# Patient Record
Sex: Female | Born: 1955
Health system: Southern US, Community
[De-identification: ages and names within clinical notes are randomized; demographics above are authoritative.]

## PROBLEM LIST (undated history)

## (undated) DIAGNOSIS — J302 Other seasonal allergic rhinitis: Secondary | ICD-10-CM

## (undated) DIAGNOSIS — R928 Other abnormal and inconclusive findings on diagnostic imaging of breast: Secondary | ICD-10-CM

## (undated) DIAGNOSIS — Z789 Other specified health status: Secondary | ICD-10-CM

## (undated) HISTORY — DX: Other abnormal and inconclusive findings on diagnostic imaging of breast: R92.8

## (undated) HISTORY — PX: TONSILLECTOMY: SUR1361

## (undated) HISTORY — PX: BREAST LUMPECTOMY: SHX2

---

## 1989-06-24 HISTORY — PX: CHOLECYSTECTOMY: SHX55

## 1989-06-24 HISTORY — PX: HEMANGIOMA EXCISION: SHX1734

## 1999-11-02 ENCOUNTER — Encounter: Payer: Self-pay | Admitting: Gynecology

## 1999-11-02 ENCOUNTER — Encounter: Admission: RE | Admit: 1999-11-02 | Discharge: 1999-11-02 | Payer: Self-pay | Admitting: Gynecology

## 2001-07-08 ENCOUNTER — Encounter: Admission: RE | Admit: 2001-07-08 | Discharge: 2001-07-08 | Payer: Self-pay | Admitting: Gynecology

## 2001-07-08 ENCOUNTER — Encounter: Payer: Self-pay | Admitting: Gynecology

## 2001-07-22 ENCOUNTER — Other Ambulatory Visit: Admission: RE | Admit: 2001-07-22 | Discharge: 2001-07-22 | Payer: Self-pay | Admitting: Gynecology

## 2003-12-06 ENCOUNTER — Other Ambulatory Visit: Admission: RE | Admit: 2003-12-06 | Discharge: 2003-12-06 | Payer: Self-pay | Admitting: Gynecology

## 2003-12-08 ENCOUNTER — Encounter: Admission: RE | Admit: 2003-12-08 | Discharge: 2003-12-08 | Payer: Self-pay | Admitting: Gynecology

## 2005-01-10 ENCOUNTER — Other Ambulatory Visit: Admission: RE | Admit: 2005-01-10 | Discharge: 2005-01-10 | Payer: Self-pay | Admitting: Gynecology

## 2005-01-18 ENCOUNTER — Encounter: Admission: RE | Admit: 2005-01-18 | Discharge: 2005-01-18 | Payer: Self-pay | Admitting: Gynecology

## 2006-06-24 HISTORY — PX: DILATION AND CURETTAGE OF UTERUS: SHX78

## 2006-07-17 ENCOUNTER — Other Ambulatory Visit: Admission: RE | Admit: 2006-07-17 | Discharge: 2006-07-17 | Payer: Self-pay | Admitting: Gynecology

## 2006-07-24 ENCOUNTER — Encounter: Admission: RE | Admit: 2006-07-24 | Discharge: 2006-07-24 | Payer: Self-pay | Admitting: Gynecology

## 2006-08-07 ENCOUNTER — Encounter: Admission: RE | Admit: 2006-08-07 | Discharge: 2006-08-07 | Payer: Self-pay | Admitting: Gynecology

## 2006-08-19 ENCOUNTER — Ambulatory Visit (HOSPITAL_BASED_OUTPATIENT_CLINIC_OR_DEPARTMENT_OTHER): Admission: RE | Admit: 2006-08-19 | Discharge: 2006-08-19 | Payer: Self-pay | Admitting: Gynecology

## 2006-08-19 ENCOUNTER — Encounter (INDEPENDENT_AMBULATORY_CARE_PROVIDER_SITE_OTHER): Payer: Self-pay | Admitting: *Deleted

## 2007-11-09 ENCOUNTER — Encounter: Admission: RE | Admit: 2007-11-09 | Discharge: 2007-11-09 | Payer: Self-pay | Admitting: Gynecology

## 2007-11-19 ENCOUNTER — Encounter: Admission: RE | Admit: 2007-11-19 | Discharge: 2007-11-19 | Payer: Self-pay | Admitting: Gynecology

## 2008-03-25 ENCOUNTER — Encounter: Admission: RE | Admit: 2008-03-25 | Discharge: 2008-03-25 | Payer: Self-pay | Admitting: Gynecology

## 2008-10-04 ENCOUNTER — Encounter: Admission: RE | Admit: 2008-10-04 | Discharge: 2008-10-04 | Payer: Self-pay | Admitting: Gynecology

## 2010-01-09 ENCOUNTER — Encounter: Admission: RE | Admit: 2010-01-09 | Discharge: 2010-01-09 | Payer: Self-pay | Admitting: Gynecology

## 2010-07-15 ENCOUNTER — Encounter: Payer: Self-pay | Admitting: Gynecology

## 2010-11-09 NOTE — Op Note (Signed)
[name], Kerry Monroe               ACCOUNT NO.:  [id number]   MEDICAL RECORD NO.:  [id number]          PATIENT TYPE:  AMB   LOCATION:  [facility]                         FACILITY:  [facility] ([place])   PHYSICIAN:  Gretta Cool, M.D. DATE OF BIRTH:  Sep 25, 1955   DATE OF PROCEDURE:  08/19/2006  DATE OF DISCHARGE:                               OPERATIVE REPORT   PREOPERATIVE DIAGNOSIS:  Endometrial polyps, multiple, with abnormal  uterine bleeding.   POSTOPERATIVE DIAGNOSIS:  Endometrial polyps, multiple, with abnormal  uterine bleeding.   PROCEDURE:  Hysteroscopy, resection of multiple endometrial polyps, and  total endometrial resection for ablation plus VaporTrode.   SURGEON:  Gretta Cool, M.D.   ANESTHESIA:  Paracervical block and IV sedation.   DESCRIPTION OF PROCEDURE:  Under excellent general anesthesia, as above.  Her cervix was grasped with a single-tooth tenaculum and then  progressively dilated with a series of Pratt dilators to accommodate the  7 mm resectoscope.  At this point, the resectoscope was introduced, and  the uterine cavity photographed.  There were two large polyps protruding  from the fundal uterine fundus, one from the left cornual area and one  from the right.  There was also a prolapsed fibroid hanging through the  cervix to the outside.  These polyps were progressively resected so as  to remove the entire polyps, and then the endometrial cavity was totally  resected, extending 360 degrees around the cavity.  Once all of the  evidence of gland openings was resected, the endometrial cavity was  treated by VaporTrode at 200 watts per cut.  The entire endometrial  cavity was then treated so as to eradicate any superficial myometrium  and help prevent recurrence of bleeding or polyp formation.  At this  point, once the entire cavity was treated, the pressure was reduced, and  the bleeding points coagulated.  At this point, patient returned to the  recovery room in  excellent condition without complication.  Estimated  fluid deficit 130 cc.   COMPLICATIONS:  None.           ______________________________  Gretta Cool, M.D.     CWL/MEDQ  D:  08/19/2006  T:  08/19/2006  Job:  [id number]

## 2011-01-28 ENCOUNTER — Other Ambulatory Visit: Payer: Self-pay | Admitting: Gynecology

## 2011-01-28 DIAGNOSIS — Z1231 Encounter for screening mammogram for malignant neoplasm of breast: Secondary | ICD-10-CM

## 2011-02-05 ENCOUNTER — Ambulatory Visit
Admission: RE | Admit: 2011-02-05 | Discharge: 2011-02-05 | Disposition: A | Discharge status: Home / Self Care | Payer: BC Managed Care – PPO | Source: Ambulatory Visit | Attending: Gynecology | Admitting: Gynecology

## 2011-02-05 DIAGNOSIS — Z1231 Encounter for screening mammogram for malignant neoplasm of breast: Secondary | ICD-10-CM

## 2011-02-08 ENCOUNTER — Other Ambulatory Visit: Payer: Self-pay | Admitting: Gynecology

## 2011-06-17 ENCOUNTER — Ambulatory Visit (INDEPENDENT_AMBULATORY_CARE_PROVIDER_SITE_OTHER): Payer: BC Managed Care – PPO

## 2011-06-17 DIAGNOSIS — R0902 Hypoxemia: Secondary | ICD-10-CM

## 2011-06-17 DIAGNOSIS — H66009 Acute suppurative otitis media without spontaneous rupture of ear drum, unspecified ear: Secondary | ICD-10-CM

## 2011-06-17 DIAGNOSIS — R0602 Shortness of breath: Secondary | ICD-10-CM

## 2012-07-09 ENCOUNTER — Other Ambulatory Visit: Payer: Self-pay | Admitting: Gynecology

## 2012-07-09 DIAGNOSIS — Z1231 Encounter for screening mammogram for malignant neoplasm of breast: Secondary | ICD-10-CM

## 2012-07-17 ENCOUNTER — Other Ambulatory Visit: Payer: Self-pay | Admitting: Gynecology

## 2012-07-17 DIAGNOSIS — D1800 Hemangioma unspecified site: Secondary | ICD-10-CM

## 2012-07-29 ENCOUNTER — Other Ambulatory Visit: Payer: BC Managed Care – PPO

## 2012-07-29 ENCOUNTER — Ambulatory Visit
Admission: RE | Admit: 2012-07-29 | Discharge: 2012-07-29 | Disposition: A | Discharge status: Home / Self Care | Payer: BC Managed Care – PPO | Source: Ambulatory Visit | Attending: Gynecology | Admitting: Gynecology

## 2012-07-29 DIAGNOSIS — Z1231 Encounter for screening mammogram for malignant neoplasm of breast: Secondary | ICD-10-CM

## 2012-07-30 ENCOUNTER — Other Ambulatory Visit: Payer: Self-pay | Admitting: Gynecology

## 2012-07-30 DIAGNOSIS — R928 Other abnormal and inconclusive findings on diagnostic imaging of breast: Secondary | ICD-10-CM

## 2012-08-05 ENCOUNTER — Ambulatory Visit: Payer: BC Managed Care – PPO

## 2012-08-05 ENCOUNTER — Ambulatory Visit
Admission: RE | Admit: 2012-08-05 | Discharge: 2012-08-05 | Disposition: A | Discharge status: Home / Self Care | Payer: BC Managed Care – PPO | Source: Ambulatory Visit | Attending: Gynecology | Admitting: Gynecology

## 2012-08-05 DIAGNOSIS — D1800 Hemangioma unspecified site: Secondary | ICD-10-CM

## 2012-08-12 ENCOUNTER — Ambulatory Visit: Payer: BC Managed Care – PPO

## 2012-08-19 ENCOUNTER — Ambulatory Visit
Admission: RE | Admit: 2012-08-19 | Discharge: 2012-08-19 | Disposition: A | Discharge status: Home / Self Care | Payer: BC Managed Care – PPO | Source: Ambulatory Visit | Attending: Gynecology | Admitting: Gynecology

## 2012-08-19 DIAGNOSIS — R928 Other abnormal and inconclusive findings on diagnostic imaging of breast: Secondary | ICD-10-CM

## 2012-09-01 ENCOUNTER — Ambulatory Visit (INDEPENDENT_AMBULATORY_CARE_PROVIDER_SITE_OTHER): Payer: BC Managed Care – PPO | Admitting: Surgery

## 2012-09-01 ENCOUNTER — Encounter (INDEPENDENT_AMBULATORY_CARE_PROVIDER_SITE_OTHER): Payer: Self-pay | Admitting: Surgery

## 2012-09-01 VITALS — BP 116/72 | HR 84 | Temp 97.6°F | Resp 12 | Ht 66.5 in | Wt 175.0 lb

## 2012-09-01 DIAGNOSIS — R928 Other abnormal and inconclusive findings on diagnostic imaging of breast: Secondary | ICD-10-CM

## 2012-09-01 HISTORY — DX: Other abnormal and inconclusive findings on diagnostic imaging of breast: R92.8

## 2012-09-01 NOTE — Progress Notes (Signed)
Kerry Monroe       DOB: [birth year]           DATE: 09/01/2012       ZOX:[id number]  CC:  Chief Complaint  Patient presents with  . Breast Problem    eval lt bt distortion    HPI: this patient recently had a 3-D mammogram an area of architectural distortion was reported to be in the left breast medially. A followup mammogram and ultrasound were unremarkable and the abnormality was not seen on the mammogram. Because of the findings on the 3-D mammogram surgical consultation for excisional biopsy was recommended. The patient is having no breast symptoms. She does in the past she has had areas of abnormality noted and followed up but no biopsy was needed. She does have a family history of postmenopausal breast cancer in her mother.  EXAM: Vital signs: BP 116/72  Pulse 84  Temp(Src) 97.6 F (36.4 C) (Temporal)  Resp 12  Ht 5' 6.5" (1.689 m)  Wt 175 lb (79.379 kg)  BMI 27.83 kg/m2  General: Patient alert, oriented, NAD  Breasts: The breasts are symmetric. There are no masses on either side. They're not tender. They're slightly irregular consistent with fibrocystic changes but this is minimal. The skin and nipple and areolar areas are normal. Lymphatics: There is no axillary or supraclavicular adenopathy noted.  Data reviewed: I have reviewed the current films and reports. I would the breast Center and reviewed films dating back to 2009. There is a similar noted the area in 2009 but not subsequently seen. It is in the same area as the current abnormality. The full 3-D mammogram is not available, only a few sample images. I discussed this at length with the radiologist and he is going to review all of the 3-D mammogram films before making position. It is possible to do a 3 localized excisional breast biopsy is needed. IMP: area of architectural distortion left breast medial aspect uncertain etiology  PLAN: I await for the radiologist to further review 3 mammogram before making a  decision. One option here might be to do an MRI and perhaps an MRI guided needle biopsy if a needle core biopsy is not feasible with mammographic imaging.  Jermiya Reichl J 09/01/2012

## 2012-09-01 NOTE — Patient Instructions (Signed)
I will be reviewing your films with the radiologist and call you after that

## 2012-09-02 ENCOUNTER — Telehealth (INDEPENDENT_AMBULATORY_CARE_PROVIDER_SITE_OTHER): Payer: Self-pay | Admitting: Surgery

## 2012-09-02 ENCOUNTER — Other Ambulatory Visit (INDEPENDENT_AMBULATORY_CARE_PROVIDER_SITE_OTHER): Payer: Self-pay | Admitting: Surgery

## 2012-09-02 DIAGNOSIS — R928 Other abnormal and inconclusive findings on diagnostic imaging of breast: Secondary | ICD-10-CM

## 2012-09-02 NOTE — Telephone Encounter (Signed)
I discussed with radiologist further. This is a spiculated mass effect on the 3D mammogram and, given that it is not on sono or regular mammogram it is more likely a radial scar than cancer. It is not really amenable to a stereo biopsy. ON MRI could be done then a biopsy, or, since is is likely radial scar and would be likely recommended for excision, we could do a NL excision. I have reviewd this with the patient, discussed alternatives etc. She would like to schedule an excision

## 2012-09-03 ENCOUNTER — Encounter (HOSPITAL_COMMUNITY): Payer: Self-pay | Admitting: Pharmacy Technician

## 2012-09-17 ENCOUNTER — Ambulatory Visit: Admit: 2012-09-17 | Payer: Self-pay | Admitting: Surgery

## 2012-09-17 ENCOUNTER — Inpatient Hospital Stay: Admission: RE | Admit: 2012-09-17 | Payer: BC Managed Care – PPO | Source: Ambulatory Visit

## 2012-09-17 SURGERY — BREAST BIOPSY WITH NEEDLE LOCALIZATION
Anesthesia: General | Site: Breast | Laterality: Left

## 2012-10-26 ENCOUNTER — Encounter (HOSPITAL_BASED_OUTPATIENT_CLINIC_OR_DEPARTMENT_OTHER): Payer: Self-pay | Admitting: *Deleted

## 2012-10-26 NOTE — Progress Notes (Signed)
No labs needed

## 2012-10-29 ENCOUNTER — Ambulatory Visit (HOSPITAL_BASED_OUTPATIENT_CLINIC_OR_DEPARTMENT_OTHER): Admission: RE | Admit: 2012-10-29 | Payer: BC Managed Care – PPO | Source: Ambulatory Visit | Admitting: Surgery

## 2012-10-29 ENCOUNTER — Telehealth (INDEPENDENT_AMBULATORY_CARE_PROVIDER_SITE_OTHER): Payer: Self-pay | Admitting: General Surgery

## 2012-10-29 ENCOUNTER — Inpatient Hospital Stay: Admission: RE | Admit: 2012-10-29 | Payer: BC Managed Care – PPO | Source: Ambulatory Visit

## 2012-10-29 HISTORY — DX: Other specified health status: Z78.9

## 2012-10-29 HISTORY — DX: Other seasonal allergic rhinitis: J30.2

## 2012-10-29 SURGERY — BREAST BIOPSY WITH NEEDLE LOCALIZATION
Anesthesia: General | Site: Breast | Laterality: Left

## 2012-10-29 NOTE — Telephone Encounter (Signed)
Spoke with her today and think she will be OK doing surgery on the 27th

## 2012-10-29 NOTE — Telephone Encounter (Signed)
I called Kerry Monroe and explained for her type of surgery she could feel okay in a couple days to a week unless she has any post op complications like an infection. She wanted to know how long it is okay to wait for her surgery since she wants a Friday and Dr Jamey Ripa is not available any Friday in June accept the 8th, which is her daughter's wedding. She did not want to talk with me about this and asked that I have Dr Jamey Ripa call her. It looks like Kerry Monroe cancelled her surgery on 09/17/2012, 09/24/2012 and it was recently cancelled for today 10/29/2012. Dr Jamey Ripa - Kerry Monroe requesting a call from you.

## 2012-10-29 NOTE — Telephone Encounter (Signed)
Message copied by Liliana Cline on Thu Oct 29, 2012  3:17 PM ------      Message from: Jari Sportsman      Created: Thu Oct 29, 2012  2:33 PM      Regarding: would like to speak with you       Contact: [phone]       Ms. Menard would like to speak to you about her procedure. She has daughter wedding June 8 wants to make sure will be ok       Thanks      Katie  ------

## 2012-11-05 MED ORDER — THROMBIN 20000 UNITS EX SOLR
CUTANEOUS | Status: AC
  Filled 2012-11-05: qty 20000

## 2012-11-10 ENCOUNTER — Encounter (HOSPITAL_BASED_OUTPATIENT_CLINIC_OR_DEPARTMENT_OTHER): Payer: Self-pay | Admitting: *Deleted

## 2012-11-10 NOTE — Progress Notes (Signed)
No labs needed

## 2012-11-17 ENCOUNTER — Encounter (HOSPITAL_BASED_OUTPATIENT_CLINIC_OR_DEPARTMENT_OTHER): Payer: Self-pay | Admitting: Anesthesiology

## 2012-11-17 ENCOUNTER — Encounter (INDEPENDENT_AMBULATORY_CARE_PROVIDER_SITE_OTHER): Payer: BC Managed Care – PPO | Admitting: Surgery

## 2012-11-17 ENCOUNTER — Ambulatory Visit (HOSPITAL_BASED_OUTPATIENT_CLINIC_OR_DEPARTMENT_OTHER)
Admission: RE | Admit: 2012-11-17 | Discharge: 2012-11-17 | Disposition: A | Discharge status: Home / Self Care | Payer: BC Managed Care – PPO | Source: Ambulatory Visit | Attending: Surgery | Admitting: Surgery

## 2012-11-17 ENCOUNTER — Ambulatory Visit
Admission: RE | Admit: 2012-11-17 | Discharge: 2012-11-17 | Disposition: A | Discharge status: Home / Self Care | Payer: BC Managed Care – PPO | Source: Ambulatory Visit | Attending: Surgery | Admitting: Surgery

## 2012-11-17 ENCOUNTER — Ambulatory Visit (HOSPITAL_BASED_OUTPATIENT_CLINIC_OR_DEPARTMENT_OTHER): Payer: BC Managed Care – PPO | Admitting: Anesthesiology

## 2012-11-17 ENCOUNTER — Encounter (HOSPITAL_BASED_OUTPATIENT_CLINIC_OR_DEPARTMENT_OTHER)
Admission: RE | Disposition: A | Discharge status: Home / Self Care | Payer: Self-pay | Source: Ambulatory Visit | Attending: Surgery

## 2012-11-17 DIAGNOSIS — R928 Other abnormal and inconclusive findings on diagnostic imaging of breast: Secondary | ICD-10-CM

## 2012-11-17 DIAGNOSIS — Z880 Allergy status to penicillin: Secondary | ICD-10-CM | POA: Insufficient documentation

## 2012-11-17 DIAGNOSIS — N6089 Other benign mammary dysplasias of unspecified breast: Secondary | ICD-10-CM | POA: Insufficient documentation

## 2012-11-17 HISTORY — PX: BREAST BIOPSY: SHX20

## 2012-11-17 SURGERY — BREAST BIOPSY WITH NEEDLE LOCALIZATION
Anesthesia: General | Site: Breast | Laterality: Left | Wound class: Clean

## 2012-11-17 MED ORDER — HYDROMORPHONE HCL PF 1 MG/ML IJ SOLN: 0.2500 mg | INTRAMUSCULAR | Status: DC | PRN

## 2012-11-17 MED ORDER — ONDANSETRON HCL 4 MG/2ML IJ SOLN: 4.0000 mg | Freq: Once | INTRAMUSCULAR | Status: DC | PRN

## 2012-11-17 MED ORDER — HYDROCODONE-ACETAMINOPHEN 5-325 MG PO TABS: 1.0000 | ORAL_TABLET | ORAL | Status: DC | PRN

## 2012-11-17 MED ORDER — LACTATED RINGERS IV SOLN
INTRAVENOUS | Status: DC
  Administered 2012-11-17: 20 mL/h via INTRAVENOUS
  Administered 2012-11-17: 11:00:00 via INTRAVENOUS

## 2012-11-17 MED ORDER — CHLORHEXIDINE GLUCONATE 4 % EX LIQD: 1.0000 "application " | Freq: Once | CUTANEOUS | Status: DC

## 2012-11-17 MED ORDER — FENTANYL CITRATE 0.05 MG/ML IJ SOLN
INTRAMUSCULAR | Status: DC | PRN
  Administered 2012-11-17: 100 ug via INTRAVENOUS
  Administered 2012-11-17 (×2): 25 ug via INTRAVENOUS

## 2012-11-17 MED ORDER — DEXAMETHASONE SODIUM PHOSPHATE 4 MG/ML IJ SOLN
INTRAMUSCULAR | Status: DC | PRN
  Administered 2012-11-17: 10 mg via INTRAVENOUS

## 2012-11-17 MED ORDER — OXYCODONE HCL 5 MG/5ML PO SOLN: 5.0000 mg | Freq: Once | ORAL | Status: DC | PRN

## 2012-11-17 MED ORDER — ONDANSETRON HCL 4 MG/2ML IJ SOLN
INTRAMUSCULAR | Status: DC | PRN
  Administered 2012-11-17: 4 mg via INTRAVENOUS

## 2012-11-17 MED ORDER — LIDOCAINE HCL (CARDIAC) 20 MG/ML IV SOLN
INTRAVENOUS | Status: DC | PRN
  Administered 2012-11-17: 80 mg via INTRAVENOUS

## 2012-11-17 MED ORDER — CIPROFLOXACIN IN D5W 400 MG/200ML IV SOLN
400.0000 mg | INTRAVENOUS | Status: AC
  Administered 2012-11-17: 400 mg via INTRAVENOUS

## 2012-11-17 MED ORDER — PROPOFOL 10 MG/ML IV BOLUS
INTRAVENOUS | Status: DC | PRN
  Administered 2012-11-17: 160 mg via INTRAVENOUS

## 2012-11-17 MED ORDER — OXYCODONE HCL 5 MG PO TABS: 5.0000 mg | ORAL_TABLET | Freq: Once | ORAL | Status: DC | PRN

## 2012-11-17 MED ORDER — MIDAZOLAM HCL 5 MG/5ML IJ SOLN
INTRAMUSCULAR | Status: DC | PRN
  Administered 2012-11-17: 2 mg via INTRAVENOUS

## 2012-11-17 MED ORDER — BUPIVACAINE HCL (PF) 0.25 % IJ SOLN
INTRAMUSCULAR | Status: DC | PRN
  Administered 2012-11-17: 20 mL

## 2012-11-17 SURGICAL SUPPLY — 48 items
ADH SKN CLS APL DERMABOND .7 (GAUZE/BANDAGES/DRESSINGS) ×1
APPLICATOR COTTON TIP 6IN STRL (MISCELLANEOUS) IMPLANT
BINDER BREAST LRG (GAUZE/BANDAGES/DRESSINGS) IMPLANT
BINDER BREAST MEDIUM (GAUZE/BANDAGES/DRESSINGS) IMPLANT
BINDER BREAST XLRG (GAUZE/BANDAGES/DRESSINGS) ×2 IMPLANT
BINDER BREAST XXLRG (GAUZE/BANDAGES/DRESSINGS) IMPLANT
BLADE HEX COATED 2.75 (ELECTRODE) ×2 IMPLANT
BLADE SURG 15 STRL LF DISP TIS (BLADE) ×1 IMPLANT
BLADE SURG 15 STRL SS (BLADE) ×2
CANISTER SUCTION 1200CC (MISCELLANEOUS) ×2 IMPLANT
CHLORAPREP W/TINT 26ML (MISCELLANEOUS) ×2 IMPLANT
CLIP TI MEDIUM 6 (CLIP) IMPLANT
CLIP TI WIDE RED SMALL 6 (CLIP) IMPLANT
CLOTH BEACON ORANGE TIMEOUT ST (SAFETY) ×2 IMPLANT
COVER MAYO STAND STRL (DRAPES) ×2 IMPLANT
COVER TABLE BACK 60X90 (DRAPES) ×2 IMPLANT
DECANTER SPIKE VIAL GLASS SM (MISCELLANEOUS) IMPLANT
DERMABOND ADVANCED (GAUZE/BANDAGES/DRESSINGS) ×1
DERMABOND ADVANCED .7 DNX12 (GAUZE/BANDAGES/DRESSINGS) ×1 IMPLANT
DEVICE DUBIN W/COMP PLATE 8390 (MISCELLANEOUS) ×2 IMPLANT
DRAPE LAPAROTOMY TRNSV 102X78 (DRAPE) ×2 IMPLANT
DRAPE SURG 17X23 STRL (DRAPES) IMPLANT
DRAPE UTILITY XL STRL (DRAPES) ×2 IMPLANT
ELECT REM PT RETURN 9FT ADLT (ELECTROSURGICAL) ×2
ELECTRODE REM PT RTRN 9FT ADLT (ELECTROSURGICAL) ×1 IMPLANT
GLOVE BIO SURGEON STRL SZ7 (GLOVE) ×4 IMPLANT
GLOVE BIOGEL PI IND STRL 7.0 (GLOVE) ×2 IMPLANT
GLOVE BIOGEL PI INDICATOR 7.0 (GLOVE) ×2
GLOVE EUDERMIC 7 POWDERFREE (GLOVE) ×2 IMPLANT
GOWN PREVENTION PLUS XLARGE (GOWN DISPOSABLE) IMPLANT
KIT MARKER MARGIN INK (KITS) IMPLANT
NEEDLE HYPO 25X1 1.5 SAFETY (NEEDLE) ×2 IMPLANT
NS IRRIG 1000ML POUR BTL (IV SOLUTION) ×2 IMPLANT
PACK BASIN DAY SURGERY FS (CUSTOM PROCEDURE TRAY) ×2 IMPLANT
PENCIL BUTTON HOLSTER BLD 10FT (ELECTRODE) ×2 IMPLANT
SHEET MEDIUM DRAPE 40X70 STRL (DRAPES) IMPLANT
SLEEVE SCD COMPRESS KNEE MED (MISCELLANEOUS) ×2 IMPLANT
SPONGE GAUZE 4X4 12PLY (GAUZE/BANDAGES/DRESSINGS) IMPLANT
SPONGE INTESTINAL PEANUT (DISPOSABLE) IMPLANT
SPONGE LAP 4X18 X RAY DECT (DISPOSABLE) ×2 IMPLANT
STAPLER VISISTAT 35W (STAPLE) IMPLANT
SUT MNCRL AB 4-0 PS2 18 (SUTURE) ×2 IMPLANT
SUT SILK 0 TIES 10X30 (SUTURE) IMPLANT
SUT VICRYL 3-0 CR8 SH (SUTURE) ×2 IMPLANT
SYR CONTROL 10ML LL (SYRINGE) ×2 IMPLANT
TOWEL OR NON WOVEN STRL DISP B (DISPOSABLE) ×2 IMPLANT
TUBE CONNECTING 20X1/4 (TUBING) ×2 IMPLANT
YANKAUER SUCT BULB TIP NO VENT (SUCTIONS) ×2 IMPLANT

## 2012-11-17 NOTE — H&P (Signed)
  [name] DOB: 17-Jan-1956 MRN: [id number]                                                                                      DATE: 10/29/2012  PCP: No primary provider on file. Referring Provider: No ref. provider found  IMPRESSION:  Architectural distortion left breast  PLAN:   or localized excision. I have discussed the indications for the lumpectomy and described the procedure. She understand that the chance of removal of the abnormal area is very good, but that occasionally we are unable to locate it and may have to do a second procedure. We also discussed the possibility of a second procedure to get additional tissue. Risks of surgery such as bleeding and infection have also been explained, as well as the implications of not doing the surgery. She understands and wishes to proceed.                  CC: No chief complaint on file.   HPI:  Kerry Monroe Monroe is a 57 y.o.  female who presents for surgery for an area of architectural distortion in the left breast that is not amenable to a needle core biopsy.  PMH:  has a past medical history of Seasonal allergies and Medical history non-contributory.  PSH:   has past surgical history that includes Cesarean section; Tonsillectomy; Dilation and curettage of uterus (2008); Hemangioma excision (1991); and Cholecystectomy (1991).  ALLERGIES:   Allergies  Allergen Reactions  . Penicillins Anaphylaxis    MEDICATIONS: Current facility-administered medications:chlorhexidine (HIBICLENS) 4 % liquid 1 application, 1 application, Topical, Once, [name], MD;  [name] ON 11/18/2012] chlorhexidine (HIBICLENS) 4 % liquid 1 application, 1 application, Topical, Once, [name], MD;  ciprofloxacin (CIPRO) IVPB 400 mg, 400 mg, Intravenous, On Call to OR, [name], MD lactated ringers infusion, , Intravenous, Continuous, Hart Robinsons, MD, Last Rate: 20 mL/hr at 11/17/12 1111   EXAM:   Vital signs:BP  112/67  Pulse 69  Temp(Src) 97.6 F (36.4 Kerry Monroe) (Oral)  Resp 20  Ht 5\' 6"  (1.676 m)  Wt 168 lb (76.204 kg)  BMI 27.13 kg/m2  SpO2 97% General: The patient alert oriented healthy-appearing, NAD Lungs: Clear to auscultation Heart: Regular no murmurs rubs or gallops Breasts: Guidewire in situ left breast  DATA REVIEWED:  I reviewed the wire localizing films.    Kerry Monroe Monroe J 11/17/2012  CC: No ref. provider found, No primary provider on file.

## 2012-11-17 NOTE — Anesthesia Preprocedure Evaluation (Signed)

## 2012-11-17 NOTE — Anesthesia Postprocedure Evaluation (Signed)
  Anesthesia Post-op Note  Patient: Kerry Monroe  Procedure(s) Performed: Procedure(s): BREAST mass WITH NEEDLE LOCALIZATION (Left)  Patient Location: PACU  Anesthesia Type:General  Level of Consciousness: awake, alert  and oriented  Airway and Oxygen Therapy: Patient Spontanous Breathing  Post-op Pain: mild  Post-op Assessment: Post-op Vital signs reviewed  Post-op Vital Signs: Reviewed  Complications: No apparent anesthesia complications

## 2012-11-17 NOTE — Op Note (Signed)
Kerry Monroe [birth year] [id number] 10/29/2012  Preoperative diagnosis: Architectural abnormality, left breast, uncertain etiology  Postoperative diagnosis: same  Procedure: wire localized excisional biopsy, left breast  Surgeon: [name], MD, FACS  Anesthesia: General   Clinical History and Indications: this patient has an area of architectural abnormality in the left breast which is not amenable to core biopsy and excision was recommended    Description of Procedure: I saw the patient in the preoperative area and confirmed the plans. I marked the left breast as the operative site. I reviewed the wire  localizing films. The patient was taken to the operating room and after satisfactory general anesthesia was obtained the left breast was prepped and draped and a time out was done. The guidewire entered in the inferior portion of the breast to tract somewhat superior and deep. I made a curvilinear incision, divided about 1.5 cm of fatty and subcutaneous tissue and grasped the guidewire tract with 2 Allis clamps. I then took a cylinder of tissue around the 2 guidewire clamps and wife I was beyond the tip of the guidewire I started to try to divide the tissue but found that the guidewire went even further in. I then replaced the Allis clamps a little bit deeper and continued to excise tissue around the guidewire until I was beyond the tip. I did note a small cystic mass with some gelatinous material that was about two thirds of the way down the specimen. I didn't feel any tumor in the specimen nor any tumor residual in the breasts. I was down to the chest wall inferior and posterior.  RD the specimen mammogram with the radiologist. I then made sure everything was dry, infiltrated 20 cc of 0.25% plain Marcaine, and closed in layers with 3-0 Vicryl, 4-0 Monocryl subcuticular, and Dermabond. The procedure well, there were no complications, and counts were correct. Blood loss was  minimal. [name], MD, [facility] 11/17/2012 12:37 PM

## 2012-11-17 NOTE — Transfer of Care (Signed)
Immediate Anesthesia Transfer of Care Note  Patient: Kerry Monroe  Procedure(s) Performed: Procedure(s): BREAST mass WITH NEEDLE LOCALIZATION (Left)  Patient Location: PACU  Anesthesia Type:General  Level of Consciousness: sedated  Airway & Oxygen Therapy: Patient Spontanous Breathing and Patient connected to face mask oxygen  Post-op Assessment: Report given to PACU RN and Post -op Vital signs reviewed and stable  Post vital signs: Reviewed and stable  Complications: No apparent anesthesia complications

## 2012-11-17 NOTE — Anesthesia Procedure Notes (Signed)
Procedure Name: LMA Insertion Date/Time: 11/17/2012 11:52 AM Performed by: Burna Cash Pre-anesthesia Checklist: Patient identified, Emergency Drugs available, Suction available and Patient being monitored Patient Re-evaluated:Patient Re-evaluated prior to inductionOxygen Delivery Method: Circle System Utilized Preoxygenation: Pre-oxygenation with 100% oxygen Intubation Type: IV induction Ventilation: Mask ventilation without difficulty LMA: LMA inserted LMA Size: 4.0 Number of attempts: 1 Airway Equipment and Method: bite block Placement Confirmation: positive ETCO2 Tube secured with: Tape Dental Injury: Teeth and Oropharynx as per pre-operative assessment

## 2012-11-18 ENCOUNTER — Telehealth (INDEPENDENT_AMBULATORY_CARE_PROVIDER_SITE_OTHER): Payer: Self-pay | Admitting: General Surgery

## 2012-11-18 NOTE — Telephone Encounter (Signed)
Patient is  Aware of po f/u visit day and time

## 2012-11-19 ENCOUNTER — Telehealth (INDEPENDENT_AMBULATORY_CARE_PROVIDER_SITE_OTHER): Payer: Self-pay | Admitting: General Surgery

## 2012-11-19 NOTE — Telephone Encounter (Signed)
Message copied by Liliana Cline on Thu Nov 19, 2012 10:08 AM ------      Message from: [name]      Created: Thu Nov 19, 2012  7:20 AM       Tell her path is benign and as expected ------

## 2012-11-19 NOTE — Telephone Encounter (Signed)
Patient made aware of path results. Will follow up at appt and call with any questions prior.  

## 2012-12-04 ENCOUNTER — Ambulatory Visit (INDEPENDENT_AMBULATORY_CARE_PROVIDER_SITE_OTHER): Payer: BC Managed Care – PPO | Admitting: Surgery

## 2012-12-04 ENCOUNTER — Encounter (INDEPENDENT_AMBULATORY_CARE_PROVIDER_SITE_OTHER): Payer: Self-pay | Admitting: Surgery

## 2012-12-04 VITALS — BP 116/72 | HR 72 | Temp 99.0°F | Resp 16 | Ht 67.0 in | Wt 167.8 lb

## 2012-12-04 DIAGNOSIS — R928 Other abnormal and inconclusive findings on diagnostic imaging of breast: Secondary | ICD-10-CM

## 2012-12-04 DIAGNOSIS — Z09 Encounter for follow-up examination after completed treatment for conditions other than malignant neoplasm: Secondary | ICD-10-CM

## 2012-12-04 NOTE — Patient Instructions (Signed)
We will see you again on an as needed basis. Please call the office at [phone] if you have any questions or concerns. Thank you for allowing us to take care of you.  

## 2012-12-04 NOTE — Progress Notes (Signed)
Kerry Monroe    [id number] 12/04/2012    [birth year]   CC:   Chief Complaint  Patient presents with  . Routine Post Op    po lt breast mass 11/17/12     HPI:  The patient returns for post op follow-up. She underwent a left NL excisional breast biopsy on 11/17/12. Over all she feels that she is doing well.   PE: VITAL SIGNS: BP 116/72  Pulse 72  Temp(Src) 99 F (37.2 C) (Temporal)  Resp 16  Ht 5\' 7"  (1.702 m)  Wt 167 lb 12.8 oz (76.114 kg)  BMI 26.28 kg/m2  Breast: The incision is healing nicely and there is no evidence of infection or hematoma.   DATA REVIEWED: Pathology report: Diagnosis Breast, lumpectomy, Left - FIBROCYSTIC CHANGES WITH USUAL DUCTAL HYPERPLASIA. - THERE IS NO EVIDENCE OF MALIGNANCY. - SEE COMMENT. Microscopic Comment Grossly, there is a 1.0 cm well defined nodule which on histologic evaluation reveals fibrocystic changes with usual ductal hyperplasia. Malignant features are not identified. The surgical resection margins of the specimen were inked and microscopically evaluated. (JBK:gt, 11/18/12) Pecola Leisure MD Pathologist, Electronic Signature (Case signed 11/18/2012)  IMPRESSION: Patient doing well. Path benign  PLAN: RTC PRN discussed path and gave her a copy.

## 2014-03-18 ENCOUNTER — Other Ambulatory Visit: Payer: Self-pay

## 2014-03-18 DIAGNOSIS — Z1231 Encounter for screening mammogram for malignant neoplasm of breast: Secondary | ICD-10-CM

## 2014-04-01 ENCOUNTER — Ambulatory Visit: Payer: BC Managed Care – PPO

## 2014-06-03 ENCOUNTER — Ambulatory Visit
Admission: RE | Admit: 2014-06-03 | Discharge: 2014-06-03 | Disposition: A | Discharge status: Home / Self Care | Payer: BC Managed Care – PPO | Source: Ambulatory Visit

## 2014-06-03 DIAGNOSIS — Z1231 Encounter for screening mammogram for malignant neoplasm of breast: Secondary | ICD-10-CM

## 2015-06-22 ENCOUNTER — Other Ambulatory Visit: Payer: Self-pay

## 2015-06-22 DIAGNOSIS — Z1231 Encounter for screening mammogram for malignant neoplasm of breast: Secondary | ICD-10-CM

## 2015-07-19 ENCOUNTER — Ambulatory Visit
Admission: RE | Admit: 2015-07-19 | Discharge: 2015-07-19 | Disposition: A | Discharge status: Home / Self Care | Payer: BLUE CROSS/BLUE SHIELD | Source: Ambulatory Visit

## 2015-07-19 DIAGNOSIS — Z1231 Encounter for screening mammogram for malignant neoplasm of breast: Secondary | ICD-10-CM

## 2016-08-23 ENCOUNTER — Other Ambulatory Visit: Payer: Self-pay | Admitting: Obstetrics and Gynecology

## 2016-08-23 DIAGNOSIS — Z1231 Encounter for screening mammogram for malignant neoplasm of breast: Secondary | ICD-10-CM

## 2016-09-13 ENCOUNTER — Ambulatory Visit: Payer: PRIVATE HEALTH INSURANCE

## 2017-09-18 ENCOUNTER — Ambulatory Visit (INDEPENDENT_AMBULATORY_CARE_PROVIDER_SITE_OTHER): Payer: PRIVATE HEALTH INSURANCE | Admitting: Orthopedic Surgery

## 2017-09-18 ENCOUNTER — Ambulatory Visit (INDEPENDENT_AMBULATORY_CARE_PROVIDER_SITE_OTHER): Payer: PRIVATE HEALTH INSURANCE

## 2017-09-18 ENCOUNTER — Encounter (INDEPENDENT_AMBULATORY_CARE_PROVIDER_SITE_OTHER): Payer: Self-pay | Admitting: Orthopedic Surgery

## 2017-09-18 VITALS — Ht 67.0 in | Wt 167.0 lb

## 2017-09-18 DIAGNOSIS — M25571 Pain in right ankle and joints of right foot: Secondary | ICD-10-CM | POA: Diagnosis not present

## 2017-09-18 NOTE — Progress Notes (Signed)
Office Visit Note   Patient: Kerry Monroe           Date of Birth: 1955-12-24           MRN: [id number] Visit Date: 09/18/2017              Requested by: No referring provider defined for this encounter. PCP: System, Provider Not In  Chief Complaint  Patient presents with  . Right Ankle - Pain    DOI 09/18/17 twist injury right ankle       HPI: Patient is a 62 year old woman who states that she missed a step today sustaining a twisting injury of her right ankle she has lateral ankle pain and swelling and discoloration.  Assessment & Plan: Visit Diagnoses:  1. Pain in right ankle and joints of right foot     Plan: Recommended using her ASO continue with ice and elevation anti-inflammatories as needed increase her activities as she feels comfortable.  Follow-Up Instructions: Return if symptoms worsen or fail to improve.   Ortho Exam  Patient is alert, oriented, no adenopathy, well-dressed, normal affect, normal respiratory effort. Examination patient has a good dorsalis pedis pulse she has a negative anterior drawer proximal fibula is nontender to palpation the syndesmosis is nontender to compression.  She has no tenderness to palpation over the medial or lateral malleolus.  She is point tender to palpation and has bruising and swelling over the anterior talofibular ligament.  Grade 1 sprain left ankle.  Imaging: Xr Ankle Complete Right  Result Date: 09/18/2017 3 view radiographs of the left ankle shows a congruent mortise there is no evidence of a fracture no joint space narrowing no syndesmosis widening.  No images are attached to the encounter.  Labs: No results found for: HGBA1C, ESRSEDRATE, CRP, LABURIC, REPTSTATUS, GRAMSTAIN, CULT, LABORGA  @LABSALLVALUES (HGBA1)@  Body mass index is 26.16 kg/m.  Orders:  Orders Placed This Encounter  Procedures  . XR Ankle Complete Right   No orders of the defined types were placed in this encounter.    Procedures: No procedures performed  Clinical Data: No additional findings.  ROS:  All other systems negative, except as noted in the HPI. Review of Systems  Objective: Vital Signs: Ht 5\' 7"  (1.702 m)   Wt 167 lb (75.8 kg)   BMI 26.16 kg/m   Specialty Comments:  No specialty comments available.  PMFS History: Patient Active Problem List   Diagnosis Date Noted  . Abnormal mammogram 09/01/2012   Past Medical History:  Diagnosis Date  . Abnormal mammogram 09/01/2012   NL excisional Bx > benign F/C changes   . Medical history non-contributory   . Seasonal allergies     Family History  Problem Relation Age of Onset  . Heart disease Father   . Cancer Sister        adenocarcinoma of appendix    Past Surgical History:  Procedure Laterality Date  . BREAST BIOPSY Left 11/17/2012   Procedure: BREAST mass WITH NEEDLE LOCALIZATION;  Surgeon: Haywood Lasso, MD;  Location: [place];  Service: General;  Laterality: Left;  . CESAREAN SECTION     x2  . CHOLECYSTECTOMY  1991  . DILATION AND CURETTAGE OF UTERUS  2008   uterine polyps and ablasion  . HEMANGIOMA EXCISION  1991   [facility] at [facility]   . TONSILLECTOMY     Social History   Occupational History  . Not on file  Tobacco Use  . Smoking  status: Never Smoker  . Smokeless tobacco: Never Used  Substance and Sexual Activity  . Alcohol use: No  . Drug use: No  . Sexual activity: Not on file

## 2018-04-21 ENCOUNTER — Other Ambulatory Visit: Payer: Self-pay | Admitting: Obstetrics and Gynecology

## 2018-04-21 DIAGNOSIS — Z1231 Encounter for screening mammogram for malignant neoplasm of breast: Secondary | ICD-10-CM

## 2018-06-03 ENCOUNTER — Encounter: Payer: Self-pay | Admitting: Radiology

## 2018-06-03 ENCOUNTER — Ambulatory Visit
Admission: RE | Admit: 2018-06-03 | Discharge: 2018-06-03 | Disposition: A | Discharge status: Home / Self Care | Payer: PRIVATE HEALTH INSURANCE | Source: Ambulatory Visit | Attending: Obstetrics and Gynecology | Admitting: Obstetrics and Gynecology

## 2018-06-03 DIAGNOSIS — Z1231 Encounter for screening mammogram for malignant neoplasm of breast: Secondary | ICD-10-CM

## 2019-04-29 ENCOUNTER — Other Ambulatory Visit: Payer: Self-pay | Admitting: Obstetrics and Gynecology

## 2019-04-29 DIAGNOSIS — Z1231 Encounter for screening mammogram for malignant neoplasm of breast: Secondary | ICD-10-CM

## 2019-06-04 DIAGNOSIS — Z0000 Encounter for general adult medical examination without abnormal findings: Secondary | ICD-10-CM | POA: Diagnosis not present

## 2019-06-04 DIAGNOSIS — R7989 Other specified abnormal findings of blood chemistry: Secondary | ICD-10-CM | POA: Diagnosis not present

## 2019-06-11 DIAGNOSIS — E663 Overweight: Secondary | ICD-10-CM | POA: Diagnosis not present

## 2019-06-11 DIAGNOSIS — G4700 Insomnia, unspecified: Secondary | ICD-10-CM | POA: Diagnosis not present

## 2019-06-11 DIAGNOSIS — Z0000 Encounter for general adult medical examination without abnormal findings: Secondary | ICD-10-CM | POA: Diagnosis not present

## 2019-06-11 DIAGNOSIS — J302 Other seasonal allergic rhinitis: Secondary | ICD-10-CM | POA: Diagnosis not present

## 2019-06-11 DIAGNOSIS — L209 Atopic dermatitis, unspecified: Secondary | ICD-10-CM | POA: Diagnosis not present

## 2019-06-11 DIAGNOSIS — N6019 Diffuse cystic mastopathy of unspecified breast: Secondary | ICD-10-CM | POA: Diagnosis not present

## 2019-06-11 DIAGNOSIS — I8390 Asymptomatic varicose veins of unspecified lower extremity: Secondary | ICD-10-CM | POA: Diagnosis not present

## 2019-06-11 DIAGNOSIS — H269 Unspecified cataract: Secondary | ICD-10-CM | POA: Diagnosis not present

## 2019-06-11 DIAGNOSIS — R519 Headache, unspecified: Secondary | ICD-10-CM | POA: Diagnosis not present

## 2019-07-02 ENCOUNTER — Ambulatory Visit
Admission: RE | Admit: 2019-07-02 | Discharge: 2019-07-02 | Disposition: A | Discharge status: Home / Self Care | Payer: PPO | Source: Ambulatory Visit | Attending: Obstetrics and Gynecology | Admitting: Obstetrics and Gynecology

## 2019-07-02 ENCOUNTER — Other Ambulatory Visit: Payer: Self-pay

## 2019-07-02 DIAGNOSIS — Z1231 Encounter for screening mammogram for malignant neoplasm of breast: Secondary | ICD-10-CM | POA: Diagnosis not present

## 2020-02-03 DIAGNOSIS — D485 Neoplasm of uncertain behavior of skin: Secondary | ICD-10-CM | POA: Diagnosis not present

## 2020-02-03 DIAGNOSIS — D2272 Melanocytic nevi of left lower limb, including hip: Secondary | ICD-10-CM | POA: Diagnosis not present

## 2020-02-03 DIAGNOSIS — L821 Other seborrheic keratosis: Secondary | ICD-10-CM | POA: Diagnosis not present

## 2020-02-03 DIAGNOSIS — D225 Melanocytic nevi of trunk: Secondary | ICD-10-CM | POA: Diagnosis not present

## 2020-02-03 DIAGNOSIS — D1801 Hemangioma of skin and subcutaneous tissue: Secondary | ICD-10-CM | POA: Diagnosis not present

## 2020-04-17 DIAGNOSIS — H25011 Cortical age-related cataract, right eye: Secondary | ICD-10-CM | POA: Diagnosis not present

## 2020-04-17 DIAGNOSIS — H5203 Hypermetropia, bilateral: Secondary | ICD-10-CM | POA: Diagnosis not present

## 2020-04-17 DIAGNOSIS — H2512 Age-related nuclear cataract, left eye: Secondary | ICD-10-CM | POA: Diagnosis not present

## 2020-06-09 DIAGNOSIS — Z0000 Encounter for general adult medical examination without abnormal findings: Secondary | ICD-10-CM | POA: Diagnosis not present

## 2020-06-09 DIAGNOSIS — R7989 Other specified abnormal findings of blood chemistry: Secondary | ICD-10-CM | POA: Diagnosis not present

## 2020-06-30 DIAGNOSIS — E663 Overweight: Secondary | ICD-10-CM | POA: Diagnosis not present

## 2020-06-30 DIAGNOSIS — R519 Headache, unspecified: Secondary | ICD-10-CM | POA: Diagnosis not present

## 2020-06-30 DIAGNOSIS — J302 Other seasonal allergic rhinitis: Secondary | ICD-10-CM | POA: Diagnosis not present

## 2020-06-30 DIAGNOSIS — Z0000 Encounter for general adult medical examination without abnormal findings: Secondary | ICD-10-CM | POA: Diagnosis not present

## 2020-06-30 DIAGNOSIS — I8390 Asymptomatic varicose veins of unspecified lower extremity: Secondary | ICD-10-CM | POA: Diagnosis not present

## 2020-06-30 DIAGNOSIS — H269 Unspecified cataract: Secondary | ICD-10-CM | POA: Diagnosis not present

## 2020-06-30 DIAGNOSIS — G4700 Insomnia, unspecified: Secondary | ICD-10-CM | POA: Diagnosis not present

## 2020-06-30 DIAGNOSIS — L209 Atopic dermatitis, unspecified: Secondary | ICD-10-CM | POA: Diagnosis not present

## 2020-06-30 DIAGNOSIS — N6019 Diffuse cystic mastopathy of unspecified breast: Secondary | ICD-10-CM | POA: Diagnosis not present

## 2020-06-30 DIAGNOSIS — Z23 Encounter for immunization: Secondary | ICD-10-CM | POA: Diagnosis not present

## 2020-07-10 ENCOUNTER — Other Ambulatory Visit: Payer: PPO

## 2020-07-11 ENCOUNTER — Other Ambulatory Visit: Payer: Self-pay | Admitting: Obstetrics and Gynecology

## 2020-07-11 DIAGNOSIS — Z0000 Encounter for general adult medical examination without abnormal findings: Secondary | ICD-10-CM

## 2020-07-12 DIAGNOSIS — Z1152 Encounter for screening for COVID-19: Secondary | ICD-10-CM | POA: Diagnosis not present

## 2020-08-25 ENCOUNTER — Ambulatory Visit
Admission: RE | Admit: 2020-08-25 | Discharge: 2020-08-25 | Disposition: A | Discharge status: Home / Self Care | Payer: PPO | Source: Ambulatory Visit | Attending: Obstetrics and Gynecology | Admitting: Obstetrics and Gynecology

## 2020-08-25 ENCOUNTER — Other Ambulatory Visit: Payer: Self-pay

## 2020-08-25 DIAGNOSIS — Z1231 Encounter for screening mammogram for malignant neoplasm of breast: Secondary | ICD-10-CM | POA: Diagnosis not present

## 2020-08-25 DIAGNOSIS — Z0000 Encounter for general adult medical examination without abnormal findings: Secondary | ICD-10-CM

## 2020-09-22 DIAGNOSIS — Z6828 Body mass index (BMI) 28.0-28.9, adult: Secondary | ICD-10-CM | POA: Diagnosis not present

## 2020-09-22 DIAGNOSIS — Z01411 Encounter for gynecological examination (general) (routine) with abnormal findings: Secondary | ICD-10-CM | POA: Diagnosis not present

## 2020-09-22 DIAGNOSIS — Z803 Family history of malignant neoplasm of breast: Secondary | ICD-10-CM | POA: Diagnosis not present

## 2020-09-22 DIAGNOSIS — M25511 Pain in right shoulder: Secondary | ICD-10-CM | POA: Diagnosis not present

## 2020-09-22 DIAGNOSIS — G4709 Other insomnia: Secondary | ICD-10-CM | POA: Diagnosis not present

## 2020-09-22 DIAGNOSIS — R922 Inconclusive mammogram: Secondary | ICD-10-CM | POA: Diagnosis not present

## 2020-09-22 DIAGNOSIS — Z01419 Encounter for gynecological examination (general) (routine) without abnormal findings: Secondary | ICD-10-CM | POA: Diagnosis not present

## 2020-09-22 DIAGNOSIS — N951 Menopausal and female climacteric states: Secondary | ICD-10-CM | POA: Diagnosis not present

## 2020-10-13 ENCOUNTER — Other Ambulatory Visit: Payer: Self-pay

## 2020-10-13 ENCOUNTER — Ambulatory Visit: Payer: PPO | Admitting: Family Medicine

## 2020-10-13 ENCOUNTER — Ambulatory Visit: Payer: Self-pay

## 2020-10-13 VITALS — BP 118/78 | Ht 65.0 in | Wt 185.0 lb

## 2020-10-13 DIAGNOSIS — M25511 Pain in right shoulder: Secondary | ICD-10-CM

## 2020-10-13 DIAGNOSIS — M25562 Pain in left knee: Secondary | ICD-10-CM | POA: Diagnosis not present

## 2020-10-13 DIAGNOSIS — G8929 Other chronic pain: Secondary | ICD-10-CM | POA: Diagnosis not present

## 2020-10-13 NOTE — Progress Notes (Addendum)
SUBJECTIVE:   CHIEF COMPLAINT / HPI:   Left Knee Pain Kerry Monroe is a very pleasant 64y/o female who presents with left knee pain. The pain has been ongoing for "most of my adult life" and is intermittent. She has flare ups where the pain develops on the medial aspect of her knee, gets worse with activity, better with rest. Stiffness in the morning or after long periods of sitting and stiffness gets better with walking or movement. Episodes come and go but associated with increased activity. Pain gets up to a 7 or 8 out of 10. No major swelling. No locking, catching, or giving way. She tries a knee sleeve, Tylenol with little benefit.  Right Shoulder Pain This began 6 months ago with no inciting event. The pain is more located just below the proximal humerus along the medial shaft. She denies neck pain, numbness, tingling, but does feel like pain and weakness can radiate down her arm and to her hand. The motion of putting her arm behind her back does reproduce the pain in the location stated above. No previous history of shoulder injury.   PERTINENT  PMH / PSH: None  OBJECTIVE:   BP 118/78   Ht 5\' 5"  (1.651 m)   Wt 185 lb (83.9 kg)   BMI 30.79 kg/m   No flowsheet data found.  Knee, Left: Inspection was negative for erythema, ecchymosis, and effusion. No obvious bony abnormalities or signs of osteophyte development. Palpation yielded no asymmetric warmth; Positive for medial joint line tenderness; No condyle tenderness; No patellar tenderness; No patellar crepitus. Patellar and quadriceps tendons unremarkable, and no tenderness of the pes anserine bursa. No obvious Baker's cyst development. ROM normal in flexion (135 degrees) and extension (0 degrees). Normal hamstring and quadriceps strength. Neurovascularly intact bilaterally. Special Tests  - Cruciate Ligaments:   - Anterior Drawer:  NEG - Posterior Drawer: NEG  - Collateral Ligaments:   - Varus/Valgus Stress test: NEG  -  Meniscus:   - McMurray's: NEG  - Patella:   - Patellar grind/compression: NEG  Shoulder, Right: No evidence of bony deformity, asymmetry, or muscle atrophy; No tenderness over long head of biceps (bicipital groove). No TTP at [facility] joint. TTP at the pec major insertion on the humeral head. Full active and passive range of motion (180 flex Huel Cote /150Abd /90ER /70IR), Thumb to T12 with significant tenderness. Strength 5/5 throughout except in flexion and abduction. Grip strength, pincer strength, and finger abduction strength intact. No abnormal scapular function observed. Sensation intact. Peripheral pulses intact.  Special Tests:   - Crossarm test: NEG - Jobe test: Positive   - Hawkins: NEG   - Neer test: NEG   - Belly press test: NEG   - Drop arm test: NEG  Limited MSK U/S: Right Shoulder [place] shoulder: -Biceps tendon: Not well visualized within the bicipital groove.  There attachment of the biceps at the shaft of the humerus consistent with old chronic tear. No fluid accumulation within the bicipital groove. -Pectoralis: Insertion visualized and without abnormalities. -Subscapularis: Well visualized to insertion point on humerus.  Possible surface partial tear with irregularity of the humeral head.  Dynamic testing over the coracoid did not show signs of impingement. -Supraspinatus: No calcification in the anterior fibers distal insertion of the supraspinatus.  Some fiber irregularity but no full thickness tearing or retraction seen. Dynamic testing did not reveal signs of impingement. -Subacromial bursa: Mild to moderate bursal swelling -Infraspinatus/teres minor: Insertion point on posterior humerus visualized and  without abnormalities. Conclusion: Possible partial chronic supscap tear and some irregularity of the supraspinatus and mild to moderate bursitis.   ASSESSMENT/PLAN:   Right shoulder pain Unclear etiology at this point. She does have chronic changes on ultrasound and  weakness of shoulder abduction on testing. However, the specific location of her pain and the received grip strength is not well explained. - Formal physical therapy - Voltaren Gel as she does not like taking lots of medicaiton - F/u in 4 weeks; if no improvement consider MRI  Left knee pain Most likely acute on chronic osteoarthritis. - HEP - Voltaren Gel     [name], DO PGY-4, Sports Medicine Fellow [place]  I was the preceptor for this visit and available for immediate consultation Shellia Cleverly, DO

## 2020-10-13 NOTE — Assessment & Plan Note (Addendum)
Unclear etiology at this point. She does have chronic changes on ultrasound and weakness of shoulder abduction on testing. However, the specific location of her pain and the received grip strength is not well explained. - Formal physical therapy - Voltaren Gel as she does not like taking lots of medicaiton - F/u in 4 weeks; if no improvement consider MRI

## 2020-10-13 NOTE — Assessment & Plan Note (Signed)
Most likely acute on chronic osteoarthritis. - HEP - Voltaren Gel

## 2020-10-13 NOTE — Patient Instructions (Signed)
It was great to meet you today! Thank you for letting me participate in your care!  Today, we discussed your right shoulder/arm pain. Your ultrasound did show some chronic changes and perhaps a partial rotator cuff tear but no real good explanation for your pain. I do think using Voltaren Gel and some formal physical therapy is appropriate.  For you knee also use the gel and do the home exercises we discussed. You most likely have knee arthritis causing your pain.  I will see you in 4 weeks to see how you are progressing.  Be well, Harolyn Rutherford, DO PGY-4, Sports Medicine Fellow [place]

## 2020-11-10 ENCOUNTER — Ambulatory Visit: Payer: PPO | Admitting: Family Medicine

## 2021-02-02 DIAGNOSIS — Z1211 Encounter for screening for malignant neoplasm of colon: Secondary | ICD-10-CM | POA: Diagnosis not present

## 2021-02-02 DIAGNOSIS — Z800 Family history of malignant neoplasm of digestive organs: Secondary | ICD-10-CM | POA: Diagnosis not present

## 2021-02-02 DIAGNOSIS — K635 Polyp of colon: Secondary | ICD-10-CM | POA: Diagnosis not present

## 2021-02-02 DIAGNOSIS — K644 Residual hemorrhoidal skin tags: Secondary | ICD-10-CM | POA: Diagnosis not present

## 2021-03-31 DIAGNOSIS — Z23 Encounter for immunization: Secondary | ICD-10-CM | POA: Diagnosis not present

## 2021-06-22 ENCOUNTER — Other Ambulatory Visit: Payer: Self-pay | Admitting: Obstetrics and Gynecology

## 2021-06-22 DIAGNOSIS — Z1231 Encounter for screening mammogram for malignant neoplasm of breast: Secondary | ICD-10-CM

## 2021-07-06 DIAGNOSIS — J302 Other seasonal allergic rhinitis: Secondary | ICD-10-CM | POA: Diagnosis not present

## 2021-07-06 DIAGNOSIS — Z0000 Encounter for general adult medical examination without abnormal findings: Secondary | ICD-10-CM | POA: Diagnosis not present

## 2021-07-06 DIAGNOSIS — R7989 Other specified abnormal findings of blood chemistry: Secondary | ICD-10-CM | POA: Diagnosis not present

## 2021-07-06 DIAGNOSIS — E663 Overweight: Secondary | ICD-10-CM | POA: Diagnosis not present

## 2021-07-06 DIAGNOSIS — R002 Palpitations: Secondary | ICD-10-CM | POA: Diagnosis not present

## 2021-07-06 DIAGNOSIS — R519 Headache, unspecified: Secondary | ICD-10-CM | POA: Diagnosis not present

## 2021-07-13 DIAGNOSIS — R82998 Other abnormal findings in urine: Secondary | ICD-10-CM | POA: Diagnosis not present

## 2021-07-24 DIAGNOSIS — H5203 Hypermetropia, bilateral: Secondary | ICD-10-CM | POA: Diagnosis not present

## 2021-07-24 DIAGNOSIS — H25011 Cortical age-related cataract, right eye: Secondary | ICD-10-CM | POA: Diagnosis not present

## 2021-07-24 DIAGNOSIS — H40023 Open angle with borderline findings, high risk, bilateral: Secondary | ICD-10-CM | POA: Diagnosis not present

## 2021-07-24 DIAGNOSIS — H40051 Ocular hypertension, right eye: Secondary | ICD-10-CM | POA: Diagnosis not present

## 2021-08-03 DIAGNOSIS — N6019 Diffuse cystic mastopathy of unspecified breast: Secondary | ICD-10-CM | POA: Diagnosis not present

## 2021-08-03 DIAGNOSIS — R040 Epistaxis: Secondary | ICD-10-CM | POA: Diagnosis not present

## 2021-08-03 DIAGNOSIS — E663 Overweight: Secondary | ICD-10-CM | POA: Diagnosis not present

## 2021-08-03 DIAGNOSIS — Z0000 Encounter for general adult medical examination without abnormal findings: Secondary | ICD-10-CM | POA: Diagnosis not present

## 2021-08-03 DIAGNOSIS — J302 Other seasonal allergic rhinitis: Secondary | ICD-10-CM | POA: Diagnosis not present

## 2021-08-03 DIAGNOSIS — G4700 Insomnia, unspecified: Secondary | ICD-10-CM | POA: Diagnosis not present

## 2021-08-03 DIAGNOSIS — E7800 Pure hypercholesterolemia, unspecified: Secondary | ICD-10-CM | POA: Diagnosis not present

## 2021-08-03 DIAGNOSIS — Z1331 Encounter for screening for depression: Secondary | ICD-10-CM | POA: Diagnosis not present

## 2021-08-03 DIAGNOSIS — L209 Atopic dermatitis, unspecified: Secondary | ICD-10-CM | POA: Diagnosis not present

## 2021-08-03 DIAGNOSIS — R519 Headache, unspecified: Secondary | ICD-10-CM | POA: Diagnosis not present

## 2021-08-03 DIAGNOSIS — R002 Palpitations: Secondary | ICD-10-CM | POA: Diagnosis not present

## 2021-08-03 DIAGNOSIS — Z1389 Encounter for screening for other disorder: Secondary | ICD-10-CM | POA: Diagnosis not present

## 2021-08-08 ENCOUNTER — Other Ambulatory Visit: Payer: Self-pay | Admitting: Internal Medicine

## 2021-08-08 DIAGNOSIS — E7800 Pure hypercholesterolemia, unspecified: Secondary | ICD-10-CM

## 2021-08-09 DIAGNOSIS — H25811 Combined forms of age-related cataract, right eye: Secondary | ICD-10-CM | POA: Diagnosis not present

## 2021-08-09 DIAGNOSIS — H2511 Age-related nuclear cataract, right eye: Secondary | ICD-10-CM | POA: Diagnosis not present

## 2021-08-09 DIAGNOSIS — H21561 Pupillary abnormality, right eye: Secondary | ICD-10-CM | POA: Diagnosis not present

## 2021-08-23 DIAGNOSIS — H6691 Otitis media, unspecified, right ear: Secondary | ICD-10-CM | POA: Diagnosis not present

## 2021-08-31 ENCOUNTER — Ambulatory Visit: Payer: PPO

## 2021-09-07 ENCOUNTER — Ambulatory Visit
Admission: RE | Admit: 2021-09-07 | Discharge: 2021-09-07 | Disposition: A | Discharge status: Home / Self Care | Payer: PPO | Source: Ambulatory Visit | Attending: Obstetrics and Gynecology | Admitting: Obstetrics and Gynecology

## 2021-09-07 DIAGNOSIS — Z1231 Encounter for screening mammogram for malignant neoplasm of breast: Secondary | ICD-10-CM | POA: Diagnosis not present

## 2021-09-11 ENCOUNTER — Other Ambulatory Visit: Payer: PPO

## 2021-09-12 DIAGNOSIS — D2239 Melanocytic nevi of other parts of face: Secondary | ICD-10-CM | POA: Diagnosis not present

## 2021-09-12 DIAGNOSIS — D225 Melanocytic nevi of trunk: Secondary | ICD-10-CM | POA: Diagnosis not present

## 2021-09-12 DIAGNOSIS — D485 Neoplasm of uncertain behavior of skin: Secondary | ICD-10-CM | POA: Diagnosis not present

## 2021-09-12 DIAGNOSIS — I788 Other diseases of capillaries: Secondary | ICD-10-CM | POA: Diagnosis not present

## 2021-09-12 DIAGNOSIS — L821 Other seborrheic keratosis: Secondary | ICD-10-CM | POA: Diagnosis not present

## 2021-09-12 DIAGNOSIS — D1801 Hemangioma of skin and subcutaneous tissue: Secondary | ICD-10-CM | POA: Diagnosis not present

## 2021-09-12 DIAGNOSIS — L918 Other hypertrophic disorders of the skin: Secondary | ICD-10-CM | POA: Diagnosis not present

## 2021-10-12 ENCOUNTER — Ambulatory Visit
Admission: RE | Admit: 2021-10-12 | Discharge: 2021-10-12 | Disposition: A | Discharge status: Home / Self Care | Payer: No Typology Code available for payment source | Source: Ambulatory Visit | Attending: Internal Medicine | Admitting: Internal Medicine

## 2021-10-12 DIAGNOSIS — E7800 Pure hypercholesterolemia, unspecified: Secondary | ICD-10-CM

## 2021-10-18 NOTE — Progress Notes (Signed)
?Cardiology Office Note:   ? ?Date:  10/19/2021  ? ?ID:  Kerry Monroe, DOB 1956-02-01, MRN [id number] ? ?PCP:  Shon Baton, MD  ?Cardiologist:  None  ?Electrophysiologist:  None  ? ?Referring MD: Shon Baton, MD  ? ?Chief Complaint  ?Patient presents with  ? New Patient (Initial Visit)  ? Headache  ? Shortness of Breath  ? ? ?History of Present Illness:   ? ?Kerry Monroe is a 66 y.o. female with a hx of hyperlipidemia who is referred by Dr. Virgina Jock for evaluation of CAD.  Underwent calcium score on 10/12/2021, which was 464 (94th percentile).  She reports has been having episodes of lightheadedness and shortness of breath.  Denies any chest pain.  She works out twice per week with a Physiological scientist, does weightlifting and core strengthening for 45 minutes.  Also walks for an hour 3-4 times per week.  Denies any symptoms with this.  Reports she will have sudden episodes of lightheadedness and shortness of breath.  Reports episodes of palpitations occurring every few months, last short duration.  Denies any lower extremity edema.  Has had syncopal episodes in the past when seeing blood but none recently.  No smoking history.  Family history includes father had MI in 37s and mother had CABG in 54s. ? ? ?Past Medical History:  ?Diagnosis Date  ? Abnormal mammogram 09/01/2012  ? NL excisional Bx > benign F/C changes   ? Medical history non-contributory   ? Seasonal allergies   ? ? ?Past Surgical History:  ?Procedure Laterality Date  ? BREAST BIOPSY Left 11/17/2012  ? Procedure: BREAST mass WITH NEEDLE LOCALIZATION;  Surgeon: Haywood Lasso, MD;  Location: [place];  Service: General;  Laterality: Left;  ? BREAST LUMPECTOMY Left   ? CESAREAN SECTION    ? x2  ? CHOLECYSTECTOMY  1991  ? DILATION AND CURETTAGE OF UTERUS  2008  ? uterine polyps and ablasion  ? [place]  ? [facility] hemangi[facility] liver at [facility]   ? TONSILLECTOMY    ? ? ?Current Medications: ?Current Meds  ?Medication  Sig  ? Coenzyme Q10 (CO Q 10 PO) Take 1 tablet by mouth daily.  ? rosuvastatin (CRESTOR) 10 MG tablet Take 10 mg by mouth at bedtime.  ?  ? ?Allergies:   Penicillins  ? ?Social History  ? ?Socioeconomic History  ? Marital status: Married  ?  Spouse name: Not on file  ? Number of children: Not on file  ? Years of education: Not on file  ? Highest education level: Not on file  ?Occupational History  ? Not on file  ?Tobacco Use  ? Smoking status: Never  ? Smokeless tobacco: Never  ?Substance and Sexual Activity  ? Alcohol use: No  ? Drug use: No  ? Sexual activity: Not on file  ?Other Topics Concern  ? Not on file  ?Social History Narrative  ? Not on file  ? ?Social Determinants of Health  ? ?Financial Resource Strain: Not on file  ?Food Insecurity: Not on file  ?Transportation Needs: Not on file  ?Physical Activity: Not on file  ?Stress: Not on file  ?Social Connections: Not on file  ?  ? ?Family History: ?The patient's family history includes Cancer in her sister; Heart disease in her father. There is no history of Breast cancer. ? ?ROS:   ?Please see the history of present illness.    ? All other systems reviewed and are negative. ? ?  EKGs/Labs/Other Studies Reviewed:   ? ?The following studies were reviewed today: ? ? ?EKG:   ?10/19/21: Normal sinus rhythm, rate 80, nonspecific T wave flattening ? ?Recent Labs: ?No results found for requested labs within last 8760 hours.  ?Recent Lipid Panel ?No results found for: CHOL, TRIG, HDL, CHOLHDL, VLDL, LDLCALC, LDLDIRECT ? ?Physical Exam:   ? ?VS:  BP 124/68 (BP Location: Left Arm, Patient Position: Sitting, Cuff Size: Normal)   Pulse 80   Ht '5\' 5"'$  (1.651 m)   Wt 186 lb (84.4 kg)   BMI 30.95 kg/m?    ? ?Wt Readings from Last 3 Encounters:  ?10/19/21 186 lb (84.4 kg)  ?10/13/20 185 lb (83.9 kg)  ?09/18/17 167 lb (75.8 kg)  ?  ? ?GEN:  Well nourished, well developed in no acute distress ?HEENT: Normal ?NECK: No JVD; No carotid bruits ?LYMPHATICS: No  lymphadenopathy ?CARDIAC: RRR, no murmurs, rubs, gallops ?RESPIRATORY:  Clear to auscultation without rales, wheezing or rhonchi  ?ABDOMEN: Soft, non-tender, non-distended ?MUSCULOSKELETAL:  No edema; No deformity  ?SKIN: Warm and dry ?NEUROLOGIC:  Alert and oriented x 3 ?PSYCHIATRIC:  Normal affect  ? ?ASSESSMENT:   ? ?1. Shortness of breath   ?2. Elevated coronary artery calcium score   ?3. Palpitations   ?4. Lightheadedness   ?5. Hyperlipidemia, unspecified hyperlipidemia type   ? ?PLAN:   ? ?CAD:  Underwent calcium score on 10/12/2021, which was 464 (94th percentile).  Denies any chest pain but is reporting dyspnea.  Recommending exercise Myoview to rule out ischemia.  Check echocardiogram to rule out structural heart disease.  Started on rosuvastatin 10 mg daily.  Will also add aspirin 81 mg daily ? ?Lightheadedness/palpitations: Check Zio patch x7 days to evaluate for arrhythmia ? ?Hyperlipidemia: LDL 148 on 07/06/2021.  Started on rosuvastatin 10 mg daily earlier this week.  Recheck fasting lipid panel in 2 to 3 months.  Goal LDL less than 70 ? ?RTC in 3 to 4 months ? ? ?Shared Decision Making/Informed Consent ?The risks [chest pain, shortness of breath, cardiac arrhythmias, dizziness, blood pressure fluctuations, myocardial infarction, stroke/transient ischemic attack, nausea, vomiting, allergic reaction, radiation exposure, metallic taste sensation and life-threatening complications (estimated to be 1 in 10,000)], benefits (risk stratification, diagnosing coronary artery disease, treatment guidance) and alternatives of a nuclear stress test were discussed in detail with Kerry Monroe and she agrees to proceed. ? ? ? ?Medication Adjustments/Labs and Tests Ordered: ?Current medicines are reviewed at length with the patient today.  Concerns regarding medicines are outlined above.  ?Orders Placed This Encounter  ?Procedures  ? MYOCARDIAL PERFUSION IMAGING  ? LONG TERM MONITOR (3-14 DAYS)  ? EKG 12-Lead  ?  ECHOCARDIOGRAM COMPLETE  ? ?No orders of the defined types were placed in this encounter. ? ? ?Patient Instructions  ?Medication Instructions:  ?Your physician recommends that you continue on your current medications as directed. Please refer to the Current Medication list given to you today. ? ?*If you need a refill on your cardiac medications before your next appointment, please call your pharmacy* ? ?Testing/Procedures: ?Your physician has requested that you have an echocardiogram. Echocardiography is a painless test that uses sound waves to create images of your heart. It provides your doctor with information about the size and shape of your heart and how well your heart?s chambers and valves are working. This procedure takes approximately one hour. There are no restrictions for this procedure. ? ?Your physician has requested that you have an exercise stress myoview. For further  information please visit [email or web address]. Please follow instruction sheet, as given.  ? ?ZIO XT- Long Term Monitor Instructions  ? ?Your physician has requested you wear a ZIO patch monitor for _7_ days.  ?This is a single patch monitor.   [employer] supplies one patch monitor per enrollment. Additional stickers are not available. Please do not apply patch if you will be having a Nuclear Stress Test, Echocardiogram, Cardiac CT, MRI, or Chest Xray during the period you would be wearing the monitor. The patch cannot be worn during these tests. You cannot remove and re-apply the ZIO XT patch monitor.  ?Your ZIO patch monitor will be sent Fed Ex from [employer] directly to your home address. It may take 3-5 days to receive your monitor after you have been enrolled.  ?Once you have received your monitor, please review the enclosed instructions. Your monitor has already been registered assigning a specific monitor serial # to you. ? ?Billing and Patient Assistance Program Information  ? ?We have supplied [employer] with any of your  insurance information on file for billing purposes. ?[employer] offers a sliding scale Patient Assistance Program for patients that do not have insurance, or whose insurance does not completely cover the cost of the ZIO monitor.   You must ap

## 2021-10-19 ENCOUNTER — Encounter: Payer: Self-pay | Admitting: Cardiology

## 2021-10-19 ENCOUNTER — Telehealth (HOSPITAL_COMMUNITY): Payer: Self-pay | Admitting: *Deleted

## 2021-10-19 ENCOUNTER — Ambulatory Visit (INDEPENDENT_AMBULATORY_CARE_PROVIDER_SITE_OTHER): Payer: PPO | Admitting: Cardiology

## 2021-10-19 ENCOUNTER — Ambulatory Visit (INDEPENDENT_AMBULATORY_CARE_PROVIDER_SITE_OTHER): Payer: PPO

## 2021-10-19 VITALS — BP 124/68 | HR 80 | Ht 65.0 in | Wt 186.0 lb

## 2021-10-19 DIAGNOSIS — R42 Dizziness and giddiness: Secondary | ICD-10-CM

## 2021-10-19 DIAGNOSIS — R002 Palpitations: Secondary | ICD-10-CM

## 2021-10-19 DIAGNOSIS — E785 Hyperlipidemia, unspecified: Secondary | ICD-10-CM | POA: Diagnosis not present

## 2021-10-19 DIAGNOSIS — R0602 Shortness of breath: Secondary | ICD-10-CM | POA: Diagnosis not present

## 2021-10-19 DIAGNOSIS — R931 Abnormal findings on diagnostic imaging of heart and coronary circulation: Secondary | ICD-10-CM

## 2021-10-19 MED ORDER — ASPIRIN EC 81 MG PO TBEC: 81.0000 mg | DELAYED_RELEASE_TABLET | Freq: Every day | ORAL | 3 refills | Status: AC

## 2021-10-19 NOTE — Addendum Note (Signed)
Addended by: Patria Mane A on: 10/19/2021 02:06 PM ? ? Modules accepted: Orders ? ?

## 2021-10-19 NOTE — Telephone Encounter (Signed)
Close encounter 

## 2021-10-19 NOTE — Progress Notes (Unsigned)
Enrolled patient for a 7 day Zio XT monitor to be mailed to patients home.  

## 2021-10-19 NOTE — Patient Instructions (Addendum)
Medication Instructions:  ?START aspirin 81 mg daily  ? ?*If you need a refill on your cardiac medications before your next appointment, please call your pharmacy* ? ?Testing/Procedures: ?Your physician has requested that you have an echocardiogram. Echocardiography is a painless test that uses sound waves to create images of your heart. It provides your doctor with information about the size and shape of your heart and how well your heart?s chambers and valves are working. This procedure takes approximately one hour. There are no restrictions for this procedure. ? ?Your physician has requested that you have an exercise stress myoview. For further information please visit [email or web address]. Please follow instruction sheet, as given.  ? ?ZIO XT- Long Term Monitor Instructions  ? ?Your physician has requested you wear a ZIO patch monitor for _7_ days.  ?This is a single patch monitor.   [employer] supplies one patch monitor per enrollment. Additional stickers are not available. Please do not apply patch if you will be having a Nuclear Stress Test, Echocardiogram, Cardiac CT, MRI, or Chest Xray during the period you would be wearing the monitor. The patch cannot be worn during these tests. You cannot remove and re-apply the ZIO XT patch monitor.  ?Your ZIO patch monitor will be sent Fed Ex from [employer] directly to your home address. It may take 3-5 days to receive your monitor after you have been enrolled.  ?Once you have received your monitor, please review the enclosed instructions. Your monitor has already been registered assigning a specific monitor serial # to you. ? ?Billing and Patient Assistance Program Information  ? ?We have supplied [employer] with any of your insurance information on file for billing purposes. ?[employer] offers a sliding scale Patient Assistance Program for patients that do not have insurance, or whose insurance does not completely cover the cost of the ZIO monitor.   You must apply  for the Patient Assistance Program to qualify for this discounted rate.     To apply, please call [employer] at [phone], select option 4, then select option 2, and ask to apply for Patient Assistance Program.  Theodore Demark will ask your household income, and how many people are in your household.  They will quote your out-of-pocket cost based on that information.  [employer] will also be able to set up a 43-month interest-free payment plan if needed. ? ?Applying the monitor  ? ?Shave hair from upper left chest.  ?Hold abrader disc by orange tab. Rub abrader in 40 strokes over the upper left chest as indicated in your monitor instructions.  ?Clean area with 4 enclosed alcohol pads. Let dry.  ?Apply patch as indicated in monitor instructions. Patch will be placed under collarbone on left side of chest with arrow pointing upward.  ?Rub patch adhesive wings for 2 minutes. Remove white label marked "1". Remove the white label marked "2". Rub patch adhesive wings for 2 additional minutes.  ?While looking in a mirror, press and release button in center of patch. A small green light will flash 3-4 times. This will be your only indicator that the monitor has been turned on. ?  ?Do not shower for the first 24 hours. You may shower after the first 24 hours.  ?Press the button if you feel a symptom. You will hear a small click. Record Date, Time and Symptom in the Patient Logbook.  ?When you are ready to remove the patch, follow instructions on the last 2 pages of the Patient Logbook. Stick patch monitor onto the last page  of Patient Logbook.  ?Place Patient Logbook in the blue and white box.  Use locking tab on box and tape box closed securely.  The blue and white box has prepaid postage on it. Please place it in the mailbox as soon as possible. Your physician should have your test results approximately 7 days after the monitor has been mailed back to [facility].  ?Call [facility] at [phone] if you have  questions regarding your ZIO XT patch monitor. Call them immediately if you see an orange light blinking on your monitor.  ?If your monitor falls off in less than 4 days, contact our Monitor department at [phone]. ?If your monitor becomes loose or falls off after 4 days call [employer] at [phone] for suggestions on securing your monitor.? ? ? ?Follow-Up: ?At [facility], you and your health needs are our priority.  As part of our continuing mission to provide you with exceptional heart care, we have created designated Provider Care Teams.  These Care Teams include your primary Cardiologist (physician) and Advanced Practice Providers (APPs -  Physician Assistants and Nurse Practitioners) who all work together to provide you with the care you need, when you need it. ? ?We recommend signing up for the patient portal called "MyChart".  Sign up information is provided on this After Visit Summary.  MyChart is used to connect with patients for Virtual Visits (Telemedicine).  Patients are able to view lab/test results, encounter notes, upcoming appointments, etc.  Non-urgent messages can be sent to your provider as well.   ?To learn more about what you can do with MyChart, go to [email or web address].   ? ?Your next appointment:   ?3-4 month(s) ? ?The format for your next appointment:   ?In Person ? ?Provider:   ?Dr. Gardiner Rhyme ? ? ?Important Information About Sugar ? ? ? ? ? ? ?

## 2021-10-22 ENCOUNTER — Ambulatory Visit: Payer: PPO | Admitting: Internal Medicine

## 2021-10-22 ENCOUNTER — Ambulatory Visit: Payer: PPO | Admitting: Cardiology

## 2021-10-23 ENCOUNTER — Ambulatory Visit (HOSPITAL_COMMUNITY)
Admission: RE | Admit: 2021-10-23 | Discharge: 2021-10-23 | Disposition: A | Discharge status: Home / Self Care | Payer: PPO | Source: Ambulatory Visit | Attending: Internal Medicine | Admitting: Internal Medicine

## 2021-10-23 DIAGNOSIS — R931 Abnormal findings on diagnostic imaging of heart and coronary circulation: Secondary | ICD-10-CM | POA: Diagnosis not present

## 2021-10-23 DIAGNOSIS — R0602 Shortness of breath: Secondary | ICD-10-CM | POA: Diagnosis not present

## 2021-10-23 DIAGNOSIS — R002 Palpitations: Secondary | ICD-10-CM | POA: Diagnosis not present

## 2021-10-23 LAB — MYOCARDIAL PERFUSION IMAGING
Angina Index: 0
Duke Treadmill Score: -2
Estimated workload: 10.1
Exercise duration (min): 8 min
Exercise duration (sec): 30 s
LV dias vol: 86 mL (ref 46–106)
LV sys vol: 32 mL
MPHR: 153 {beats}/min
Nuc Stress EF: 62 %
Peak HR: 157 {beats}/min
Percent HR: 102 %
Rest HR: 72 {beats}/min
Rest Nuclear Isotope Dose: 9.5 mCi
SDS: 0
SRS: 0
SSS: 0
ST Depression (mm): 2 mm
Stress Nuclear Isotope Dose: 29.8 mCi
TID: 1.12

## 2021-10-23 MED ORDER — TECHNETIUM TC 99M TETROFOSMIN IV KIT
9.5000 | PACK | Freq: Once | INTRAVENOUS | Status: AC | PRN
  Administered 2021-10-23: 9.5 via INTRAVENOUS
  Filled 2021-10-23: qty 10

## 2021-10-23 MED ORDER — TECHNETIUM TC 99M TETROFOSMIN IV KIT
29.8000 | PACK | Freq: Once | INTRAVENOUS | Status: AC | PRN
  Administered 2021-10-23: 29.8 via INTRAVENOUS
  Filled 2021-10-23: qty 30

## 2021-10-29 ENCOUNTER — Other Ambulatory Visit (HOSPITAL_COMMUNITY): Payer: PPO

## 2021-10-31 ENCOUNTER — Ambulatory Visit (HOSPITAL_COMMUNITY): Payer: PPO | Attending: Cardiology

## 2021-10-31 DIAGNOSIS — R0602 Shortness of breath: Secondary | ICD-10-CM | POA: Diagnosis not present

## 2021-10-31 DIAGNOSIS — R931 Abnormal findings on diagnostic imaging of heart and coronary circulation: Secondary | ICD-10-CM

## 2021-10-31 LAB — ECHOCARDIOGRAM COMPLETE
Area-P 1/2: 4.4 cm2
S' Lateral: 2.8 cm

## 2021-11-05 DIAGNOSIS — R002 Palpitations: Secondary | ICD-10-CM | POA: Diagnosis not present

## 2021-11-28 ENCOUNTER — Encounter: Payer: Self-pay | Admitting: Cardiology

## 2021-11-28 ENCOUNTER — Telehealth: Payer: Self-pay | Admitting: Cardiology

## 2021-11-28 MED ORDER — METOPROLOL SUCCINATE ER 25 MG PO TB24: 25.0000 mg | ORAL_TABLET | Freq: Every day | ORAL | 3 refills | Status: DC

## 2021-11-28 NOTE — Telephone Encounter (Signed)
Since unable to tolerate rosuvastatin, recommend trying atorvastatin 20 mg daily.

## 2021-11-28 NOTE — Telephone Encounter (Signed)
Patient is returning phone call to [facility]. Please call back

## 2021-11-28 NOTE — Telephone Encounter (Signed)
Kerry Heinz, MD  11/18/2021  4:22 PM EDT     Multiple episodes of fast heart rhythm from top chamber of the heart, longest lasting 36 seconds.  Recommend starting Toprol-XL 25 mg daily    Patient aware and verbalized understanding.  Rx sent to pharmacy.  Patient also reports issues with rosuvastatin-reports severe aches in her legs.  She reports being very active and was unable to take this.   She stopped the medication and symptoms resolved.        Patient also interested in seeing someone for assistance with nutrition.  Advised would see if this is something our careguide can assist with or if referral would be needed.  Routed to MD to review.

## 2021-11-29 NOTE — Telephone Encounter (Signed)
See phone note

## 2021-12-04 ENCOUNTER — Encounter: Payer: Self-pay | Admitting: Cardiology

## 2021-12-04 MED ORDER — ATORVASTATIN CALCIUM 20 MG PO TABS: 20.0000 mg | ORAL_TABLET | Freq: Every day | ORAL | 3 refills | Status: DC

## 2021-12-04 NOTE — Telephone Encounter (Signed)
Attempt to call patient, unable to leave VM (VM full).

## 2021-12-04 NOTE — Telephone Encounter (Signed)
Patient aware of recommendations and verbalized understanding.  Rx sent to pharmacy.

## 2022-01-11 DIAGNOSIS — E7800 Pure hypercholesterolemia, unspecified: Secondary | ICD-10-CM | POA: Diagnosis not present

## 2022-01-11 DIAGNOSIS — R7989 Other specified abnormal findings of blood chemistry: Secondary | ICD-10-CM | POA: Diagnosis not present

## 2022-01-21 NOTE — Progress Notes (Unsigned)
Cardiology Office Note:    Date:  01/24/2022   ID:  UNKNOWN FLANNIGAN, DOB 04-20-1956, MRN [id number]  PCP:  Shon Baton, MD  Cardiologist:  Donato Heinz, MD  Electrophysiologist:  None   Referring MD: Shon Baton, MD   Chief Complaint  Patient presents with   Coronary Artery Disease    History of Present Illness:    Kerry Monroe is a 66 y.o. female with a hx of hyperlipidemia who presents for follow-up.  She was referred by Dr. Virgina Jock for evaluation of CAD, initially seen on 10/19/2021.  Underwent calcium score on 10/12/2021, which was 464 (94th percentile).  She reports has been having episodes of lightheadedness and shortness of breath.  Denies any chest pain.  She works out twice per week with a Physiological scientist, does weightlifting and core strengthening for 45 minutes.  Also walks for an hour 3-4 times per week.  Denies any symptoms with this.  Reports she will have sudden episodes of lightheadedness and shortness of breath.  Reports episodes of palpitations occurring every few months, last short duration.  Denies any lower extremity edema.  Has had syncopal episodes in the past when seeing blood but none recently.  No smoking history.  Family history includes father had MI in 96s and mother had CABG in 86s.  Underwent exercise Myoview on 10/23/2021, which showed good exercise capacity (10.1 METS), EF 62%, and normal perfusion; 2 mm horizontal ST depressions were noted during stress, which were thought to be likely false positive EKG stress in the setting of normal perfusion.  Echocardiogram 10/31/2021 showed normal biventricular function, no significant valvular disease.  Zio patch x5 days on 11/05/2021 showed 31 episodes of SVT, longest lasting 36 seconds with average rate 147 bpm, occasional PVCs (1% of beats).  Since last clinic visit, she reports that she has been doing well.  Denies any chest pain or dyspnea.  She continues to exercise regularly, works out with a Teacher, adult education.  She reports she had myalgias on both rosuvastatin and atorvastatin, unable to tolerate.  Reports no recent palpitations.    Past Medical History:  Diagnosis Date   Abnormal mammogram 09/01/2012   NL excisional Bx > benign F/C changes    Medical history non-contributory    Seasonal allergies     Past Surgical History:  Procedure Laterality Date   BREAST BIOPSY Left 11/17/2012   Procedure: BREAST mass WITH NEEDLE LOCALIZATION;  Surgeon: Haywood Lasso, MD;  Location: [place];  Service: General;  Laterality: Left;   BREAST LUMPECTOMY Left    CESAREAN SECTION     x2   [place] OF UTERUS  2008   uterine polyps and ablasion   HEMANGIOMA EXCISION  1991   [facility] hemangioma liver at [facility]    TONSILLECTOMY      Current Medications: Current Meds  Medication Sig   aspirin EC 81 MG tablet Take 1 tablet (81 mg total) by mouth daily. Swallow whole.   metoprolol succinate (TOPROL XL) 25 MG 24 hr tablet Take 1 tablet (25 mg total) by mouth daily.     Allergies:   Penicillins and Rosuvastatin   Social History   Socioeconomic History   Marital status: Married    Spouse name: Not on file   Number of children: Not on file   Years of education: Not on file   Highest education level: Not on file  Occupational History   Not on  file  Tobacco Use   Smoking status: Never   Smokeless tobacco: Never  Substance and Sexual Activity   Alcohol use: No   Drug use: No   Sexual activity: Not on file  Other Topics Concern   Not on file  Social History Narrative   Not on file   Social Determinants of Health   Financial Resource Strain: Not on file  Food Insecurity: Not on file  Transportation Needs: Not on file  Physical Activity: Not on file  Stress: Not on file  Social Connections: Not on file     Family History: The patient's family history includes Cancer in her sister; Heart disease in her father. There is no  history of Breast cancer.  ROS:   Please see the history of present illness.     All other systems reviewed and are negative.  EKGs/Labs/Other Studies Reviewed:    The following studies were reviewed today:   EKG:   10/19/21: Normal sinus rhythm, rate 80, nonspecific T wave flattening  Recent Labs: No results found for requested labs within last 365 days.  Recent Lipid Panel No results found for: "CHOL", "TRIG", "HDL", "CHOLHDL", "VLDL", "LDLCALC", "LDLDIRECT"  Physical Exam:    VS:  BP 120/60   Pulse 75   Ht '5\' 6"'$  (1.676 m)   Wt 169 lb 6.4 oz (76.8 kg)   SpO2 99%   BMI 27.34 kg/m     Wt Readings from Last 3 Encounters:  01/24/22 169 lb 6.4 oz (76.8 kg)  10/23/21 186 lb (84.4 kg)  10/19/21 186 lb (84.4 kg)     GEN:  Well nourished, well developed in no acute distress HEENT: Normal NECK: No JVD; No carotid bruits LYMPHATICS: No lymphadenopathy CARDIAC: RRR, no murmurs, rubs, gallops RESPIRATORY:  Clear to auscultation without rales, wheezing or rhonchi  ABDOMEN: Soft, non-tender, non-distended MUSCULOSKELETAL:  No edema; No deformity  SKIN: Warm and dry NEUROLOGIC:  Alert and oriented x 3 PSYCHIATRIC:  Normal affect   ASSESSMENT:    1. Coronary artery disease involving native coronary artery of native heart without angina pectoris   2. SVT (supraventricular tachycardia) ([place])   3. Hyperlipidemia, unspecified hyperlipidemia type     PLAN:    CAD:  Underwent calcium score on 10/12/2021, which was 464 (94th percentile).  Denies any chest pain but is reporting dyspnea.  Underwent exercise Myoview on 10/23/2021, which showed good exercise capacity (10.1 METS), EF 62%, and normal perfusion; 2 mm horizontal ST depressions were noted during stress, which were thought to be likely false positive EKG stress in the setting of normal perfusion.  Echocardiogram 10/31/2021 showed normal biventricular function, no significant valvular disease.  Zio patch x5 days on 11/05/2021 showed  31 episodes of SVT, longest lasting 36 seconds with average rate 147 bpm, occasional PVCs (1% of beats). -Continue aspirin 81 mg daily -Unable to tolerate rosuvastatin or atorvastatin due to myalgias.  Refer to pharmacy lipid clinic to evaluate for PCSK9 inhibitor  SVT: Reported palpitations, Zio patch x5 days on 11/05/2021 showed 31 episodes of SVT, longest lasting 36 seconds with average rate 147 bpm, occasional PVCs (1% of beats). -Start Toprol-XL 25 mg daily  Hyperlipidemia: LDL 148 on 07/06/2021.  Calcium score 464 on 10/12/2021 as above.  Tried both rosuvastatin and atorvastatin but unable to tolerate due to myalgias.  She has been working on diet/exercise, LDL improved to 117 on 01/14/2022.  LDL remains above goal less than 70, refer to pharmacy lipid clinic to evaluate for PCSK9  inhibitor  RTC in 6 months   Medication Adjustments/Labs and Tests Ordered: Current medicines are reviewed at length with the patient today.  Concerns regarding medicines are outlined above.  Orders Placed This Encounter  Procedures   AMB Referral to [facility] Pharm[facility]   Meds ordered this encounter  Medications   metoprolol succinate (TOPROL XL) 25 MG 24 hr tablet    Sig: Take 1 tablet (25 mg total) by mouth daily.    Dispense:  90 tablet    Refill:  3    Patient Instructions  Medication Instructions:  Your physician has recommended you make the following change in your medication:  START: Metoprolol succinate (Toprol- XL) 25 mg once daily *If you need a refill on your cardiac medications before your next appointment, please call your pharmacy*   Lab Work: None If you have labs (blood work) drawn today and your tests are completely normal, you will receive your results only by: [place] (if you have MyChart) OR A paper copy in the mail If you have any lab test that is abnormal or we need to change your treatment, we will call you to review the  results.   Testing/Procedures: None   Follow-Up: At [facility], you and your health needs are our priority.  As part of our continuing mission to provide you with exceptional heart care, we have created designated Provider Care Teams.  These Care Teams include your primary Cardiologist (physician) and Advanced Practice Providers (APPs -  Physician Assistants and Nurse Practitioners) who all work together to provide you with the care you need, when you need it.  We recommend signing up for the patient portal called "MyChart".  Sign up information is provided on this After Visit Summary.  MyChart is used to connect with patients for Virtual Visits (Telemedicine).  Patients are able to view lab/test results, encounter notes, upcoming appointments, etc.  Non-urgent messages can be sent to your provider as well.   To learn more about what you can do with MyChart, go to [email or web address].    Your next appointment:   6 month(s)  The format for your next appointment:   In Person  Provider:   Donato Heinz, MD    Other Instructions   Important Information About Sugar         Signed, Donato Heinz, MD  01/24/2022 11:21 PM    [place]

## 2022-01-24 ENCOUNTER — Ambulatory Visit (INDEPENDENT_AMBULATORY_CARE_PROVIDER_SITE_OTHER): Payer: PPO | Admitting: Cardiology

## 2022-01-24 ENCOUNTER — Encounter: Payer: Self-pay | Admitting: Cardiology

## 2022-01-24 VITALS — BP 120/60 | HR 75 | Ht 66.0 in | Wt 169.4 lb

## 2022-01-24 DIAGNOSIS — I471 Supraventricular tachycardia, unspecified: Secondary | ICD-10-CM

## 2022-01-24 DIAGNOSIS — E785 Hyperlipidemia, unspecified: Secondary | ICD-10-CM

## 2022-01-24 DIAGNOSIS — I2510 Atherosclerotic heart disease of native coronary artery without angina pectoris: Secondary | ICD-10-CM

## 2022-01-24 MED ORDER — METOPROLOL SUCCINATE ER 25 MG PO TB24: 25.0000 mg | ORAL_TABLET | Freq: Every day | ORAL | 3 refills | Status: DC

## 2022-01-24 NOTE — Patient Instructions (Addendum)
Medication Instructions:  Your physician has recommended you make the following change in your medication:  START: Metoprolol succinate (Toprol- XL) 25 mg once daily *If you need a refill on your cardiac medications before your next appointment, please call your pharmacy*   Lab Work: None If you have labs (blood work) drawn today and your tests are completely normal, you will receive your results only by: [place] (if you have MyChart) OR A paper copy in the mail If you have any lab test that is abnormal or we need to change your treatment, we will call you to review the results.   Testing/Procedures: None   Follow-Up: At [facility], you and your health needs are our priority.  As part of our continuing mission to provide you with exceptional heart care, we have created designated Provider Care Teams.  These Care Teams include your primary Cardiologist (physician) and Advanced Practice Providers (APPs -  Physician Assistants and Nurse Practitioners) who all work together to provide you with the care you need, when you need it.  We recommend signing up for the patient portal called "MyChart".  Sign up information is provided on this After Visit Summary.  MyChart is used to connect with patients for Virtual Visits (Telemedicine).  Patients are able to view lab/test results, encounter notes, upcoming appointments, etc.  Non-urgent messages can be sent to your provider as well.   To learn more about what you can do with MyChart, go to [email or web address].    Your next appointment:   6 month(s)  The format for your next appointment:   In Person  Provider:   Donato Heinz, MD    Other Instructions   Important Information About Sugar

## 2022-02-18 ENCOUNTER — Ambulatory Visit: Payer: PPO | Attending: Cardiology | Admitting: Pharmacist Clinician (PhC)/ Clinical Pharmacy Specialist

## 2022-02-18 ENCOUNTER — Encounter: Payer: Self-pay | Admitting: Pharmacist Clinician (PhC)/ Clinical Pharmacy Specialist

## 2022-02-18 DIAGNOSIS — E785 Hyperlipidemia, unspecified: Secondary | ICD-10-CM

## 2022-02-18 NOTE — Patient Instructions (Signed)
Your Results:             Your most recent labs Goal  Total Cholesterol 203 < 200  Triglycerides 85 < 150  HDL (happy/good cholesterol) 69 > 40  LDL (lousy/bad cholesterol 117 < 70   Medication changes:  We will start the process to get Repatha covered by your insurance.  Once the prior authorization is complete, we will call you to let you know and confirm pharmacy information.   You will take one injection every 14 days  Lab orders:  We want to repeat labs after 2-3 months.  We will send you a lab order to remind you once we get closer to that time.    Patient Assistance:  The [employer] foundation offers assistance to help pay for medication copays.  They will cover copays for all cholesterol lowering meds, including statins, fibrates, omega-3 oils, ezetimibe, Repatha, Praluent, Nexletol, Nexlizet.  The cards are usually good for $2,500 or 12 months, whichever comes first. Go to healthwellfoundation.org Click on "Apply Now" Answer questions as to whom is applying (patient or representative) Your disease fund will be "hypercholesterolemia - Medicare access" They will ask questions about finances and which medications you are taking for cholesterol When you submit, the approval is usually within minutes.  You will need to print the card information from the site You will need to show this information to your pharmacy, they will bill your Medicare Part D plan first -then bill [employer] --for the copay.   You can also call them at [phone], although the hold times can be quite long.   Thank you for choosing [facility]

## 2022-02-18 NOTE — Assessment & Plan Note (Signed)
Patient with CAD and hyperlipidemia, not to LDL goal of < 70.  Reviewed options for lowering LDL cholesterol, including ezetimibe, PCSK-9 inhibitors, bempedoic acid and inclisiran.  Discussed mechanisms of action, dosing, side effects and potential decreases in LDL cholesterol.  Also reviewed cost information and potential options for patient assistance.  Answered all patient questions.  Based on this information, patient would prefer to start Repatha 140 mg.  Reviewed injection technique and storage information.  Once PA approved she will take 1 dose every 14 days and repeat labs in 2-3 months.

## 2022-02-18 NOTE — Progress Notes (Signed)
   02/18/2022 Kerry Monroe 04/21/1956 [id number]   HPI:  Kerry Monroe is a 66 y.o. female patient of Dr Gardiner Rhyme, who presents today for a lipid clinic evaluation.  See pertinent past medical history below.   She was first referred to cardiology by Dr. Virgina Jock for episodes of lightheadedness and SOB, as well as occasional palpitations.   Workup showed her to have a coronary calcium of 464.  Because she was intolerant to two different statin drugs she was referred to PharmD for further lipid management.    Past Medical History: CAD CAC score 464 (94th percentile)  SVT On metoprolol succ 25    Current Medications: none  Cholesterol Goals: LDL < 70   Intolerant/previously tried: rosuvastatin, atorvastatin - both caused myalgias within weeks  Family history: father had MI at 56, died at 18; mother had CABG x 5 at 20, lived to 34; older sister died apendiceal cancer at 88, younger sister Ehlers-Danlos (cousins also have); son and daughter  Diet: mix of home and eating out - stopped red meat and fried foods in January - dropped 20 pounds  Exercise:  personal trainer started in January twice weekly; walks 30 min 4-5 times each week  Labs:  7/23: TC 203, TG 85, HDL 69, LDL 117  1/23: TC 225, TG 79, HDL 61, LDL 148   Current Outpatient Medications  Medication Sig Dispense Refill   aspirin EC 81 MG tablet Take 1 tablet (81 mg total) by mouth daily. Swallow whole. 90 tablet 3   metoprolol succinate (TOPROL XL) 25 MG 24 hr tablet Take 1 tablet (25 mg total) by mouth daily. 90 tablet 3   No current facility-administered medications for this visit.    Allergies  Allergen Reactions   Penicillins Anaphylaxis   Rosuvastatin Other (See Comments)    Body aches    Past Medical History:  Diagnosis Date   Abnormal mammogram 09/01/2012   NL excisional Bx > benign F/C changes    Medical history non-contributory    Seasonal allergies     There were no vitals taken for this  visit.   Hyperlipidemia LDL goal <70 Patient with CAD and hyperlipidemia, not to LDL goal of < 70.  Reviewed options for lowering LDL cholesterol, including ezetimibe, PCSK-9 inhibitors, bempedoic acid and inclisiran.  Discussed mechanisms of action, dosing, side effects and potential decreases in LDL cholesterol.  Also reviewed cost information and potential options for patient assistance.  Answered all patient questions.  Based on this information, patient would prefer to start Repatha 140 mg.  Reviewed injection technique and storage information.  Once PA approved she will take 1 dose every 14 days and repeat labs in 2-3 months.     Tommy Medal PharmD CPP [facility] [address] [place], [place] [address] [phone]

## 2022-02-20 ENCOUNTER — Telehealth: Payer: Self-pay | Admitting: Pharmacist Clinician (PhC)/ Clinical Pharmacy Specialist

## 2022-02-20 MED ORDER — REPATHA SURECLICK 140 MG/ML ~~LOC~~ SOAJ: 140.0000 mg | SUBCUTANEOUS | 12 refills | Status: DC

## 2022-02-20 NOTE — Telephone Encounter (Signed)
Repatha approved to 08/22/22  [facility]

## 2022-02-21 DIAGNOSIS — H21562 Pupillary abnormality, left eye: Secondary | ICD-10-CM | POA: Diagnosis not present

## 2022-02-21 DIAGNOSIS — H2512 Age-related nuclear cataract, left eye: Secondary | ICD-10-CM | POA: Diagnosis not present

## 2022-02-21 DIAGNOSIS — H269 Unspecified cataract: Secondary | ICD-10-CM | POA: Diagnosis not present

## 2022-04-25 ENCOUNTER — Other Ambulatory Visit: Payer: Self-pay | Admitting: Pharmacist Clinician (PhC)/ Clinical Pharmacy Specialist

## 2022-04-25 ENCOUNTER — Encounter: Payer: Self-pay | Admitting: Pharmacist Clinician (PhC)/ Clinical Pharmacy Specialist

## 2022-04-25 DIAGNOSIS — E785 Hyperlipidemia, unspecified: Secondary | ICD-10-CM

## 2022-04-26 DIAGNOSIS — E785 Hyperlipidemia, unspecified: Secondary | ICD-10-CM | POA: Diagnosis not present

## 2022-04-26 LAB — HEPATIC FUNCTION PANEL
ALT: 16 IU/L (ref 0–32)
AST: 14 IU/L (ref 0–40)
Albumin: 4.5 g/dL (ref 3.9–4.9)
Alkaline Phosphatase: 97 IU/L (ref 44–121)
Bilirubin Total: 0.3 mg/dL (ref 0.0–1.2)
Bilirubin, Direct: 0.11 mg/dL (ref 0.00–0.40)
Total Protein: 6.7 g/dL (ref 6.0–8.5)

## 2022-04-26 LAB — LIPID PANEL
Chol/HDL Ratio: 2.2 ratio (ref 0.0–4.4)
Cholesterol, Total: 153 mg/dL (ref 100–199)
HDL: 71 mg/dL (ref >39)
LDL Chol Calc (NIH): 70 mg/dL (ref 0–99)
Triglycerides: 56 mg/dL (ref 0–149)
VLDL Cholesterol Cal: 12 mg/dL (ref 5–40)

## 2022-08-02 DIAGNOSIS — E663 Overweight: Secondary | ICD-10-CM | POA: Diagnosis not present

## 2022-08-02 DIAGNOSIS — R7989 Other specified abnormal findings of blood chemistry: Secondary | ICD-10-CM | POA: Diagnosis not present

## 2022-08-02 DIAGNOSIS — E7800 Pure hypercholesterolemia, unspecified: Secondary | ICD-10-CM | POA: Diagnosis not present

## 2022-08-02 DIAGNOSIS — R002 Palpitations: Secondary | ICD-10-CM | POA: Diagnosis not present

## 2022-08-09 DIAGNOSIS — E7800 Pure hypercholesterolemia, unspecified: Secondary | ICD-10-CM | POA: Diagnosis not present

## 2022-08-09 DIAGNOSIS — L209 Atopic dermatitis, unspecified: Secondary | ICD-10-CM | POA: Diagnosis not present

## 2022-08-09 DIAGNOSIS — R040 Epistaxis: Secondary | ICD-10-CM | POA: Diagnosis not present

## 2022-08-09 DIAGNOSIS — Z23 Encounter for immunization: Secondary | ICD-10-CM | POA: Diagnosis not present

## 2022-08-09 DIAGNOSIS — E663 Overweight: Secondary | ICD-10-CM | POA: Diagnosis not present

## 2022-08-09 DIAGNOSIS — Z0000 Encounter for general adult medical examination without abnormal findings: Secondary | ICD-10-CM | POA: Diagnosis not present

## 2022-08-09 DIAGNOSIS — I2584 Coronary atherosclerosis due to calcified coronary lesion: Secondary | ICD-10-CM | POA: Diagnosis not present

## 2022-08-09 DIAGNOSIS — R002 Palpitations: Secondary | ICD-10-CM | POA: Diagnosis not present

## 2022-08-09 DIAGNOSIS — R82998 Other abnormal findings in urine: Secondary | ICD-10-CM | POA: Diagnosis not present

## 2022-08-09 DIAGNOSIS — Z1331 Encounter for screening for depression: Secondary | ICD-10-CM | POA: Diagnosis not present

## 2022-08-09 DIAGNOSIS — I2510 Atherosclerotic heart disease of native coronary artery without angina pectoris: Secondary | ICD-10-CM | POA: Diagnosis not present

## 2022-08-21 IMAGING — CT CT CARDIAC CORONARY ARTERY CALCIUM SCORE
3 series · 14 of 20 positions shown, 16 images · non-contrast
Comparison: None.

CLINICAL DATA: 65-year-old Caucasian female with history of
hyperlipidemia. Coronary calcium scoring.

EXAM:
CT CARDIAC CORONARY ARTERY CALCIUM SCORE
TECHNIQUE: Non-contrast imaging through the heart was performed using
prospective ECG gating. Image post processing was performed on an
independent workstation, allowing for quantitative analysis of the
heart and coronary arteries. Note that this exam targets the heart
and the chest was not imaged in its entirety.

[Series 2: calcium scoring 2.00 qr36 bestdiast 70% hrt calciu · axial · 0.39mm/px · z∈[+1585,+1669]mm · 4 of 70 slices shown]
[im 14/70  vessel]
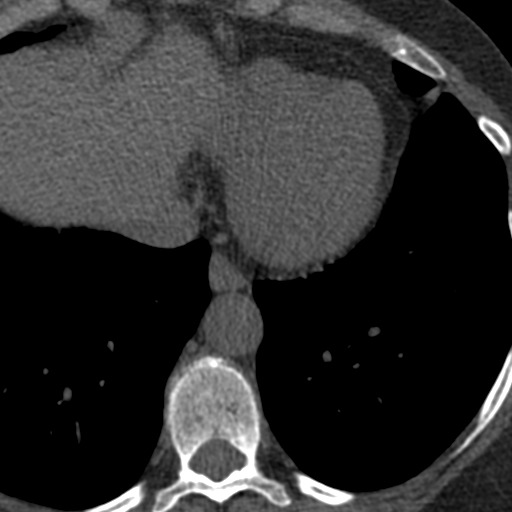
[im 28/70  vessel]
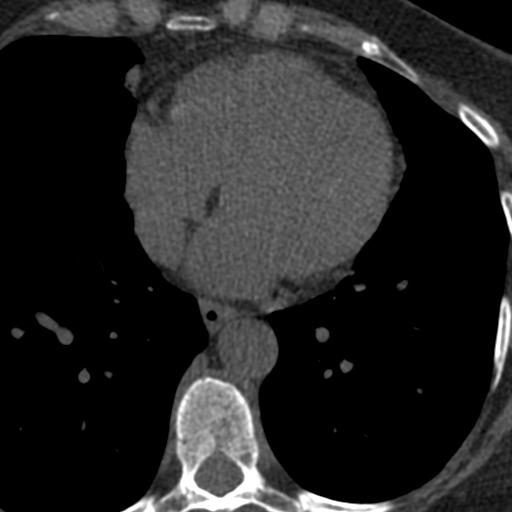
[im 42/70  vessel]
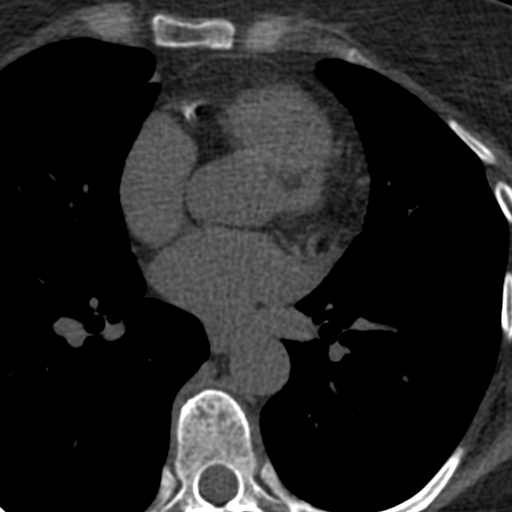
[im 56/70  vessel]
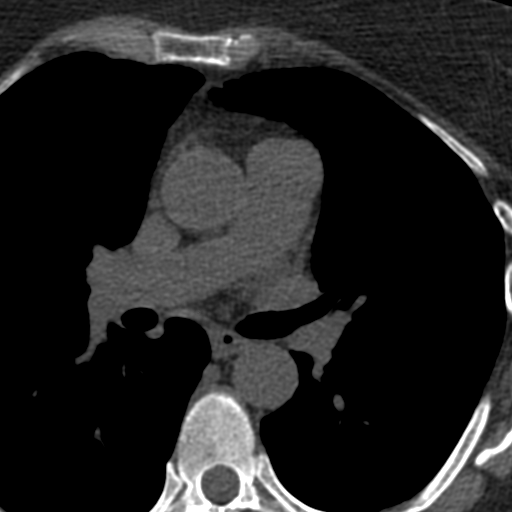

[Series 3: calcium scoring 2.00 br40 bestdiast 70% axial · axial · 0.56mm/px · z∈[+1581,+1673]mm · 5 of 70 slices shown, 7 images]
[im 12/70  vessel]
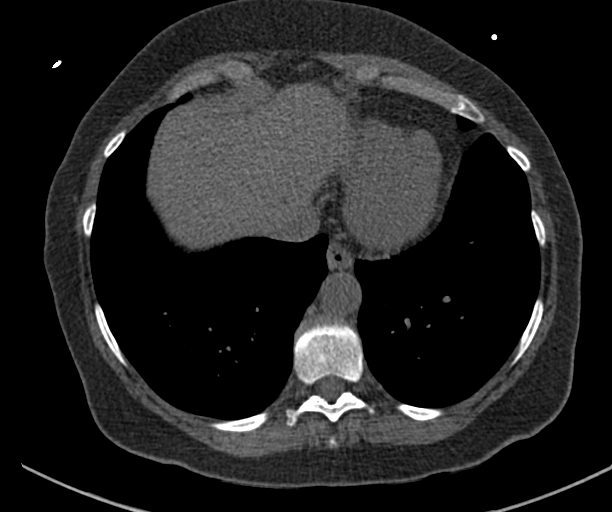
[im 12/70  lung]
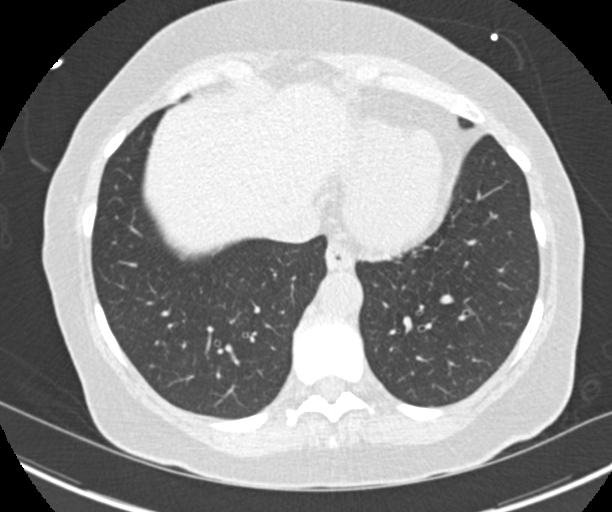
[im 24/70  vessel]
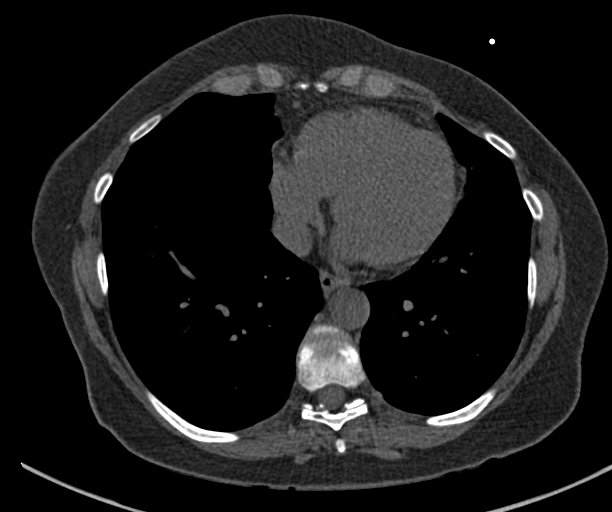
[im 35/70  vessel]
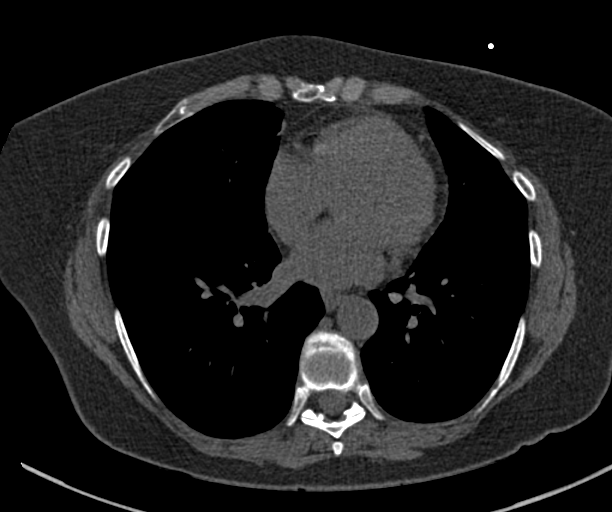
[im 47/70  vessel]
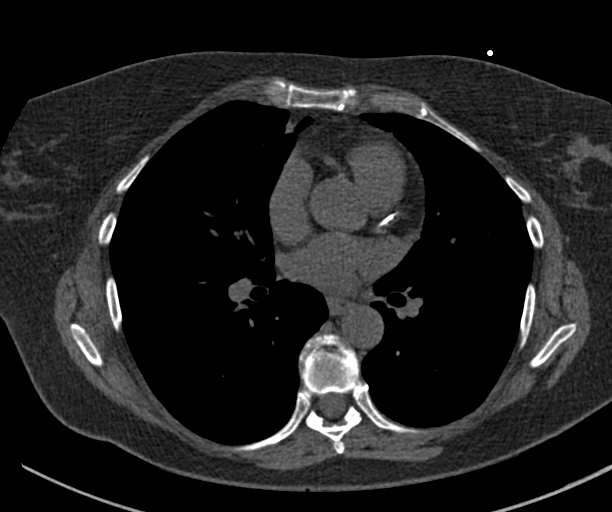
[im 58/70  vessel]
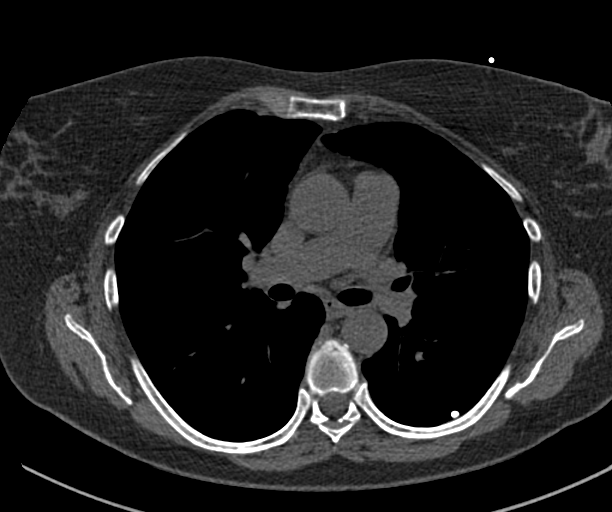
[im 58/70  lung]
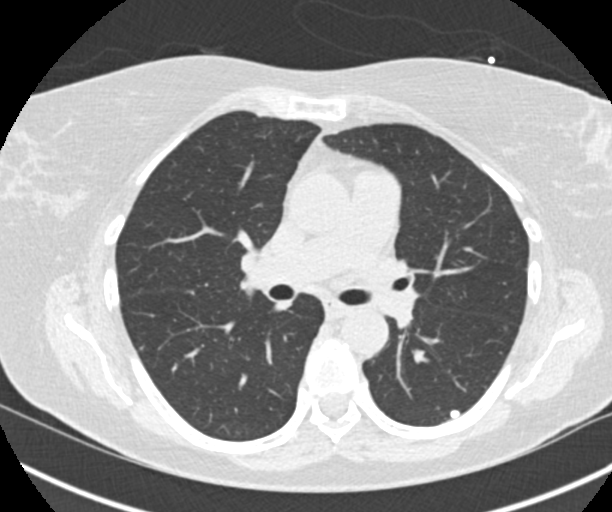

[Series 9: calcium scoring 2.00 br60 bestdiast 70% lungs · axial · 0.54mm/px · z∈[+1581,+1673]mm · 5 of 70 slices shown]
[im 12/70  vessel]
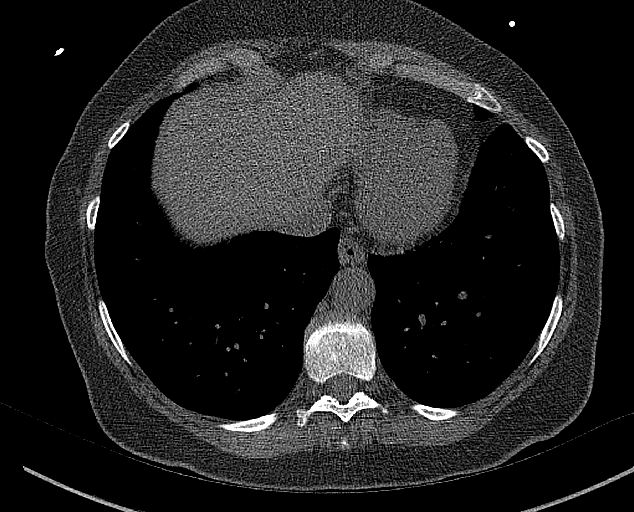
[im 24/70  vessel]
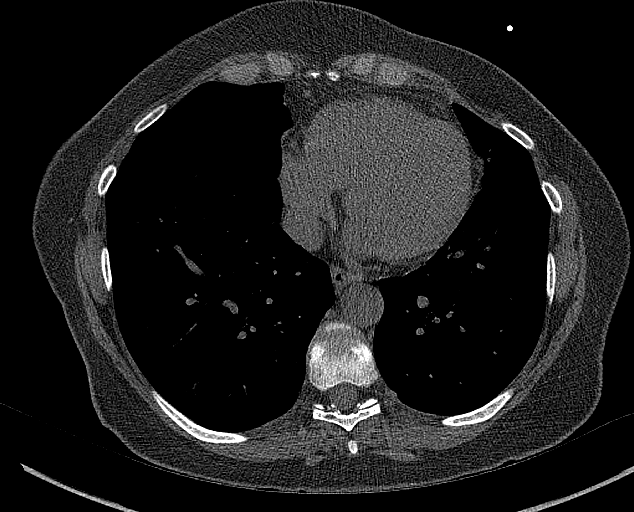
[im 35/70  vessel]
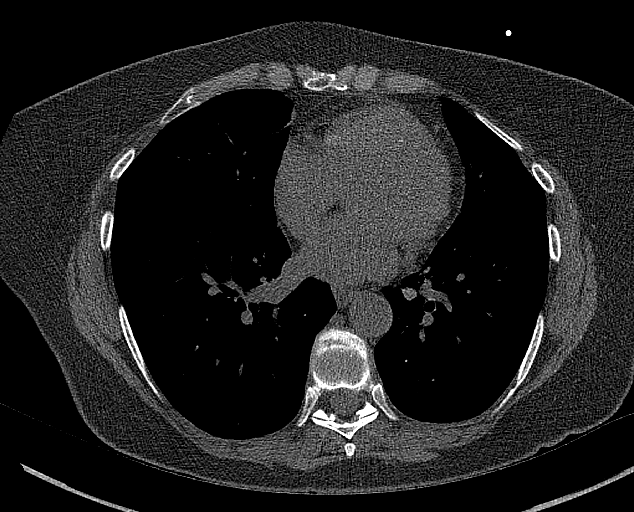
[im 47/70  vessel]
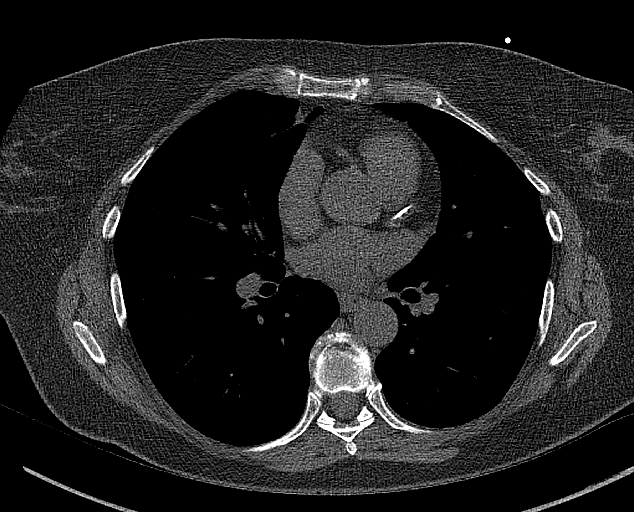
[im 58/70  vessel]
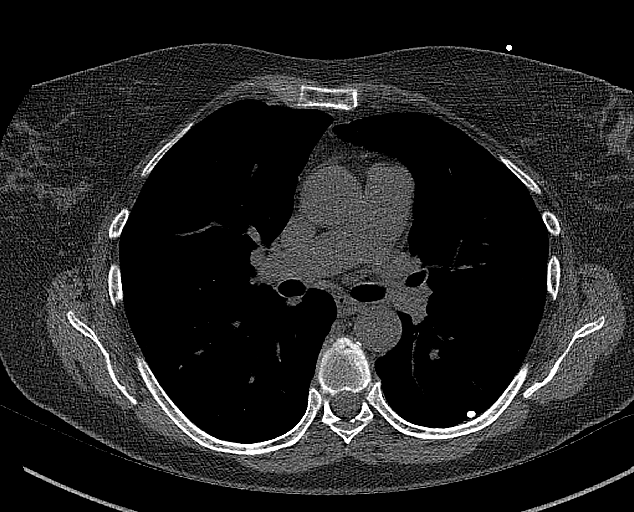

[14 of 20 positions shown; findings below may reference images not displayed]

FINDINGS: CORONARY CALCIUM SCORES:

Left Main: 0

LAD: 247

LCx: 0

RCA: 217

Total Agatston Score: 464

[HOSPITAL] percentile: Ninety-fourth

AORTA MEASUREMENTS:

Ascending Aorta: 33 mm

Descending Aorta: 25 mm

OTHER FINDINGS:

Cardiovascular: The heart is normal in size.

Mediastinum/Nodes: Visualized esophagus and trachea within normal
limits. No mediastinal lymphadenopathy.

Lungs/Pleura: The lungs are clear bilaterally. Calcified granuloma
in the posterior left lower lobe.

Upper Abdomen: The visualized upper abdomen is within normal limits.

Musculoskeletal: No acute osseous abnormality.
IMPRESSION: 1. Severe 2 vessel coronary atherosclerotic calcifications. The
observed calcium score of 247 is at the ninety-fourth percentile for
subjects of the same age, gender, and race/ethnicity who are free of
clinical cardiovascular disease and treated diabetes.
2. Evidence of prior granulomatous disease.

## 2022-08-22 NOTE — Progress Notes (Signed)
Cardiology Office Note:    Date:  08/23/2022   ID:  Kerry Monroe, DOB 07/31/1955, MRN [id number]  PCP:  Shon Baton, MD  Cardiologist:  Donato Heinz, MD  Electrophysiologist:  None   Referring MD: Shon Baton, MD   Chief Complaint  Patient presents with   Coronary Artery Disease    History of Present Illness:    Kerry Monroe is a 67 y.o. female with a hx of hyperlipidemia who presents for follow-up.  She was referred by Dr. Virgina Jock for evaluation of CAD, initially seen on 10/19/2021.  Underwent calcium score on 10/12/2021, which was 464 (94th percentile).  She reports has been having episodes of lightheadedness and shortness of breath.  Denies any chest pain.  She works out twice per week with a Physiological scientist, does weightlifting and core strengthening for 45 minutes.  Also walks for an hour 3-4 times per week.  Denies any symptoms with this.  Reports she will have sudden episodes of lightheadedness and shortness of breath.  Reports episodes of palpitations occurring every few months, last short duration.  Denies any lower extremity edema.  Has had syncopal episodes in the past when seeing blood but none recently.  No smoking history.  Family history includes father had MI in 71s and mother had CABG in 42s.  Underwent exercise Myoview on 10/23/2021, which showed good exercise capacity (10.1 METS), EF 62%, and normal perfusion; 2 mm horizontal ST depressions were noted during stress, which were thought to be likely false positive EKG stress in the setting of normal perfusion.  Echocardiogram 10/31/2021 showed normal biventricular function, no significant valvular disease.  Zio patch x5 days on 11/05/2021 showed 31 episodes of SVT, longest lasting 36 seconds with average rate 147 bpm, occasional PVCs (1% of beats).  Since last clinic visit, she reports that she is doing well.  Denies any chest pain, dyspnea, lightheadedness, syncope, lower extremity edema, or palpitations.  She  sees personal trainer twice per week for 45 minutes for weight training, also walks every day when weather is nice for 40 to 60 minutes.  Denies any exertional symptoms.   Past Medical History:  Diagnosis Date   Abnormal mammogram 09/01/2012   NL excisional Bx > benign F/C changes    Medical history non-contributory    Seasonal allergies     Past Surgical History:  Procedure Laterality Date   BREAST BIOPSY Left 11/17/2012   Procedure: BREAST mass WITH NEEDLE LOCALIZATION;  Surgeon: Haywood Lasso, MD;  Location: [facility];  Service: General;  Laterality: Left;   BREAST LUMPECTOMY Left    CESAREAN SECTION     x2   [place] OF UTERUS  2008   uterine polyps and ablasion   HEMANGIOMA EXCISION  1991   [place] hemangioma liver at [facility]    TONSILLECTOMY      Current Medications: Current Meds  Medication Sig   aspirin EC 81 MG tablet Take 1 tablet (81 mg total) by mouth daily. Swallow whole.   Evolocumab (REPATHA SURECLICK) [id number] MG/ML SOAJ Inject 140 mg into the skin every 14 (fourteen) days.   metoprolol succinate (TOPROL XL) 25 MG 24 hr tablet Take 1 tablet (25 mg total) by mouth daily.     Allergies:   Penicillins and Rosuvastatin   Social History   Socioeconomic History   Marital status: Married    Spouse name: Not on file   Number of children: Not on file  Years of education: Not on file   Highest education level: Not on file  Occupational History   Not on file  Tobacco Use   Smoking status: Never   Smokeless tobacco: Never  Substance and Sexual Activity   Alcohol use: No   Drug use: No   Sexual activity: Not on file  Other Topics Concern   Not on file  Social History Narrative   Not on file   Social Determinants of Health   Financial Resource Strain: Not on file  Food Insecurity: Not on file  Transportation Needs: Not on file  Physical Activity: Not on file  Stress: Not on file  Social Connections:  Not on file     Family History: The patient's family history includes Cancer in her sister; Heart disease in her father. There is no history of Breast cancer.  ROS:   Please see the history of present illness.     All other systems reviewed and are negative.  EKGs/Labs/Other Studies Reviewed:    The following studies were reviewed today:   EKG:   10/19/21: Normal sinus rhythm, rate 80, nonspecific T wave flattening 08/23/22: NSR, rate 64, nonspecific T wave flattening  Recent Labs: 04/26/2022: ALT 16  Recent Lipid Panel    Component Value Date/Time   CHOL 153 04/26/2022 0837   TRIG 56 04/26/2022 0837   HDL 71 04/26/2022 0837   CHOLHDL 2.2 04/26/2022 0837   LDLCALC 70 04/26/2022 0837    Physical Exam:    VS:  BP 118/66   Pulse 72   Ht '5\' 6"'$  (1.676 m)   Wt 175 lb 9.6 oz (79.7 kg)   SpO2 99%   BMI 28.34 kg/m     Wt Readings from Last 3 Encounters:  08/23/22 175 lb 9.6 oz (79.7 kg)  01/24/22 169 lb 6.4 oz (76.8 kg)  10/23/21 186 lb (84.4 kg)     GEN:  Well nourished, well developed in no acute distress HEENT: Normal NECK: No JVD; No carotid bruits LYMPHATICS: No lymphadenopathy CARDIAC: RRR, no murmurs, rubs, gallops RESPIRATORY:  Clear to auscultation without rales, wheezing or rhonchi  ABDOMEN: Soft, non-tender, non-distended MUSCULOSKELETAL:  No edema; No deformity  SKIN: Warm and dry NEUROLOGIC:  Alert and oriented x 3 PSYCHIATRIC:  Normal affect   ASSESSMENT:    1. Coronary artery disease involving native coronary artery of native heart without angina pectoris   2. SVT (supraventricular tachycardia)   3. Hyperlipidemia LDL goal <70      PLAN:    CAD:  Underwent calcium score on 10/12/2021, which was 464 (94th percentile).  Denies any chest pain but is reporting dyspnea.  Underwent exercise Myoview on 10/23/2021, which showed good exercise capacity (10.1 METS), EF 62%, and normal perfusion; 2 mm horizontal ST depressions were noted during stress, which  were thought to be likely false positive EKG stress in the setting of normal perfusion.  Echocardiogram 10/31/2021 showed normal biventricular function, no significant valvular disease.  Zio patch x5 days on 11/05/2021 showed 31 episodes of SVT, longest lasting 36 seconds with average rate 147 bpm, occasional PVCs (1% of beats). -Continue aspirin 81 mg daily -Continue Repatha  SVT: Reported palpitations, Zio patch x5 days on 11/05/2021 showed 31 episodes of SVT, longest lasting 36 seconds with average rate 147 bpm, occasional PVCs (1% of beats). -Reports palpitations have resolved, continue Toprol-XL 25 mg daily  Hyperlipidemia: LDL 148 on 07/06/2021.  Calcium score 464 on 10/12/2021 as above.  Tried both rosuvastatin and atorvastatin  but unable to tolerate due to myalgias.  She has been working on diet/exercise, LDL improved to 117 on 01/14/2022.  LDL remains above goal less than 70, referred to pharmacy lipid clinic to evaluate for PCSK9 inhibitor.  Started on Repatha, LDL improved to 66 on 08/02/22  RTC in 6 months   Medication Adjustments/Labs and Tests Ordered: Current medicines are reviewed at length with the patient today.  Concerns regarding medicines are outlined above.  Orders Placed This Encounter  Procedures   EKG 12-Lead   No orders of the defined types were placed in this encounter.   Patient Instructions  Medication Instructions:  Your physician recommends that you continue on your current medications as directed. Please refer to the Current Medication list given to you today.  *If you need a refill on your cardiac medications before your next appointment, please call your pharmacy*  Follow-Up: At [facility], you and your health needs are our priority.  As part of our continuing mission to provide you with exceptional heart care, we have created designated Provider Care Teams.  These Care Teams include your primary Cardiologist (physician) and Advanced Practice Providers  (APPs -  Physician Assistants and Nurse Practitioners) who all work together to provide you with the care you need, when you need it.  We recommend signing up for the patient portal called "MyChart".  Sign up information is provided on this After Visit Summary.  MyChart is used to connect with patients for Virtual Visits (Telemedicine).  Patients are able to view lab/test results, encounter notes, upcoming appointments, etc.  Non-urgent messages can be sent to your provider as well.   To learn more about what you can do with MyChart, go to [email or web address].    Your next appointment:   9 month(s)  Provider:   Donato Heinz, MD         Signed, Donato Heinz, MD  08/23/2022 8:24 AM    [place]

## 2022-08-23 ENCOUNTER — Ambulatory Visit: Payer: PPO | Attending: Cardiology | Admitting: Cardiology

## 2022-08-23 ENCOUNTER — Encounter: Payer: Self-pay | Admitting: Cardiology

## 2022-08-23 VITALS — BP 118/66 | HR 72 | Ht 66.0 in | Wt 175.6 lb

## 2022-08-23 DIAGNOSIS — I471 Supraventricular tachycardia, unspecified: Secondary | ICD-10-CM

## 2022-08-23 DIAGNOSIS — E785 Hyperlipidemia, unspecified: Secondary | ICD-10-CM

## 2022-08-23 DIAGNOSIS — I2510 Atherosclerotic heart disease of native coronary artery without angina pectoris: Secondary | ICD-10-CM | POA: Diagnosis not present

## 2022-08-23 NOTE — Patient Instructions (Signed)
Medication Instructions:  Your physician recommends that you continue on your current medications as directed. Please refer to the Current Medication list given to you today.  *If you need a refill on your cardiac medications before your next appointment, please call your pharmacy*  Follow-Up: At [facility], you and your health needs are our priority.  As part of our continuing mission to provide you with exceptional heart care, we have created designated Provider Care Teams.  These Care Teams include your primary Cardiologist (physician) and Advanced Practice Providers (APPs -  Physician Assistants and Nurse Practitioners) who all work together to provide you with the care you need, when you need it.  We recommend signing up for the patient portal called "MyChart".  Sign up information is provided on this After Visit Summary.  MyChart is used to connect with patients for Virtual Visits (Telemedicine).  Patients are able to view lab/test results, encounter notes, upcoming appointments, etc.  Non-urgent messages can be sent to your provider as well.   To learn more about what you can do with MyChart, go to [email or web address].    Your next appointment:   9 month(s)  Provider:   Donato Heinz, MD

## 2022-09-19 DIAGNOSIS — L821 Other seborrheic keratosis: Secondary | ICD-10-CM | POA: Diagnosis not present

## 2022-09-19 DIAGNOSIS — D1801 Hemangioma of skin and subcutaneous tissue: Secondary | ICD-10-CM | POA: Diagnosis not present

## 2022-09-19 DIAGNOSIS — D225 Melanocytic nevi of trunk: Secondary | ICD-10-CM | POA: Diagnosis not present

## 2022-09-19 DIAGNOSIS — D692 Other nonthrombocytopenic purpura: Secondary | ICD-10-CM | POA: Diagnosis not present

## 2022-10-11 ENCOUNTER — Other Ambulatory Visit: Payer: Self-pay | Admitting: Obstetrics and Gynecology

## 2022-10-11 DIAGNOSIS — E2839 Other primary ovarian failure: Secondary | ICD-10-CM

## 2022-10-11 DIAGNOSIS — N951 Menopausal and female climacteric states: Secondary | ICD-10-CM | POA: Diagnosis not present

## 2022-10-11 DIAGNOSIS — N952 Postmenopausal atrophic vaginitis: Secondary | ICD-10-CM | POA: Diagnosis not present

## 2022-10-11 DIAGNOSIS — Z01419 Encounter for gynecological examination (general) (routine) without abnormal findings: Secondary | ICD-10-CM | POA: Diagnosis not present

## 2022-10-11 DIAGNOSIS — Z1231 Encounter for screening mammogram for malignant neoplasm of breast: Secondary | ICD-10-CM | POA: Diagnosis not present

## 2023-02-18 ENCOUNTER — Other Ambulatory Visit: Payer: Self-pay | Admitting: Cardiology

## 2023-03-27 ENCOUNTER — Other Ambulatory Visit: Payer: Self-pay | Admitting: Cardiology

## 2023-03-27 DIAGNOSIS — R931 Abnormal findings on diagnostic imaging of heart and coronary circulation: Secondary | ICD-10-CM

## 2023-03-27 DIAGNOSIS — E785 Hyperlipidemia, unspecified: Secondary | ICD-10-CM

## 2023-04-04 ENCOUNTER — Encounter: Payer: Self-pay | Admitting: Cardiology

## 2023-04-04 ENCOUNTER — Other Ambulatory Visit: Payer: Self-pay | Admitting: Pharmacist Clinician (PhC)/ Clinical Pharmacy Specialist

## 2023-04-04 DIAGNOSIS — E785 Hyperlipidemia, unspecified: Secondary | ICD-10-CM

## 2023-04-04 DIAGNOSIS — R931 Abnormal findings on diagnostic imaging of heart and coronary circulation: Secondary | ICD-10-CM

## 2023-04-04 MED ORDER — REPATHA SURECLICK 140 MG/ML ~~LOC~~ SOAJ: 140.0000 mg | SUBCUTANEOUS | Status: DC

## 2023-04-18 DIAGNOSIS — H40053 Ocular hypertension, bilateral: Secondary | ICD-10-CM | POA: Diagnosis not present

## 2023-04-18 DIAGNOSIS — Z961 Presence of intraocular lens: Secondary | ICD-10-CM | POA: Diagnosis not present

## 2023-05-09 ENCOUNTER — Ambulatory Visit
Admission: RE | Admit: 2023-05-09 | Discharge: 2023-05-09 | Disposition: A | Discharge status: Home / Self Care | Payer: PPO | Source: Ambulatory Visit | Attending: Obstetrics and Gynecology | Admitting: Obstetrics and Gynecology

## 2023-05-09 DIAGNOSIS — E2839 Other primary ovarian failure: Secondary | ICD-10-CM | POA: Diagnosis not present

## 2023-05-09 DIAGNOSIS — M8588 Other specified disorders of bone density and structure, other site: Secondary | ICD-10-CM | POA: Diagnosis not present

## 2023-05-09 DIAGNOSIS — N958 Other specified menopausal and perimenopausal disorders: Secondary | ICD-10-CM | POA: Diagnosis not present

## 2023-06-19 ENCOUNTER — Ambulatory Visit: Payer: PPO | Admitting: Cardiology

## 2023-06-21 DIAGNOSIS — M25561 Pain in right knee: Secondary | ICD-10-CM | POA: Diagnosis not present

## 2023-06-30 NOTE — Progress Notes (Signed)
 Cardiology Office Note:    Date:  07/02/2023   ID:  Kerry Monroe, DOB 10/09/1955, MRN [id number]  PCP:  Onita Rush, MD  Cardiologist:  Lonni LITTIE Nanas, MD  Electrophysiologist:  None   Referring MD: Onita Rush, MD   Chief Complaint  Patient presents with   Coronary Artery Disease    History of Present Illness:    Kerry Monroe is a 68 y.o. female with a hx of hyperlipidemia who presents for follow-up.  She was referred by Dr. Onita for evaluation of CAD, initially seen on 10/19/2021.  Underwent calcium  score on 10/12/2021, which was 464 (94th percentile).  She reports has been having episodes of lightheadedness and shortness of breath.  Denies any chest pain.  She works out twice per week with a systems analyst, does weightlifting and core strengthening for 45 minutes.  Also walks for an hour 3-4 times per week.  Denies any symptoms with this.  Reports she will have sudden episodes of lightheadedness and shortness of breath.  Reports episodes of palpitations occurring every few months, last short duration.  Denies any lower extremity edema.  Has had syncopal episodes in the past when seeing blood but none recently.  No smoking history.  Family history includes father had MI in 52s and mother had CABG in 18s.  Underwent exercise Myoview  on 10/23/2021, which showed good exercise capacity (10.1 METS), EF 62%, and normal perfusion; 2 mm horizontal ST depressions were noted during stress, which were thought to be likely false positive EKG stress in the setting of normal perfusion.  Echocardiogram 10/31/2021 showed normal biventricular function, no significant valvular disease.  Zio patch x5 days on 11/05/2021 showed 31 episodes of SVT, longest lasting 36 seconds with average rate 147 bpm, occasional PVCs (1% of beats).  Since last clinic visit, she reports she is doing well.  Reports palpitations have improved, now occurring about 1-2 times per month and just lasting for few seconds.   She is walking 2 to 3 days/week for about an hour.  She also sees systems analyst and does weightlifting.  She denies any exertional chest pain or dyspnea.  Reports occasional lightheadedness but denies any syncope.  Wt Readings from Last 3 Encounters:  07/02/23 187 lb 9.6 oz (85.1 kg)  08/23/22 175 lb 9.6 oz (79.7 kg)  01/24/22 169 lb 6.4 oz (76.8 kg)     Past Medical History:  Diagnosis Date   Abnormal mammogram 09/01/2012   NL excisional Bx > benign F/C changes    Medical history non-contributory    Seasonal allergies     Past Surgical History:  Procedure Laterality Date   BREAST BIOPSY Left 11/17/2012   Procedure: BREAST mass WITH NEEDLE LOCALIZATION;  Surgeon: Sherlean JINNY Laughter, MD;  Location: [facility];  Service: General;  Laterality: Left;   BREAST LUMPECTOMY Left    CESAREAN SECTION     x2   CHOLECYSTECTOMY  1991   DILATION AND CURETTAGE OF UTERUS  2008   uterine polyps and ablasion   HEMANGIOMA EXCISION  1991   [facility] hemangioma liver at [facility]    TONSILLECTOMY      Current Medications: No outpatient medications have been marked as taking for the 07/02/23 encounter (Office Visit) with Nanas Lonni LITTIE, MD.     Allergies:   Penicillins and Rosuvastatin   Social History   Socioeconomic History   Marital status: Married    Spouse name: Not on file   Number of children: Not on  file   Years of education: Not on file   Highest education level: Not on file  Occupational History   Not on file  Tobacco Use   Smoking status: Never   Smokeless tobacco: Never  Substance and Sexual Activity   Alcohol  use: No   Drug use: No   Sexual activity: Not on file  Other Topics Concern   Not on file  Social History Narrative   Not on file   Social Drivers of Health   Financial Resource Strain: Not on file  Food Insecurity: Not on file  Transportation Needs: Not on file  Physical Activity: Not on file  Stress: Not on file  Social Connections:  Unknown (11/05/2021)   [facility]   Social Network    Social Network: Not on file     Family History: The patient's family history includes Cancer in her sister; Heart disease in her father. There is no history of Breast cancer.  ROS:   Please see the history of present illness.     All other systems reviewed and are negative.  EKGs/Labs/Other Studies Reviewed:    The following studies were reviewed today:   EKG:   10/19/21: Normal sinus rhythm, rate 80, nonspecific T wave flattening 08/23/22: NSR, rate 64, nonspecific T wave flattening 07/02/2023: Normal sinus rhythm, rate 72, low voltage  Recent Labs: No results found for requested labs within last 365 days.  Recent Lipid Panel    Component Value Date/Time   CHOL 153 04/26/2022 0837   TRIG 56 04/26/2022 0837   HDL 71 04/26/2022 0837   CHOLHDL 2.2 04/26/2022 0837   LDLCALC 70 04/26/2022 0837    Physical Exam:    VS:  BP 116/65 (BP Location: Right Arm, Patient Position: Sitting, Cuff Size: Small)   Pulse 74   Ht 5' 6 (1.676 m)   Wt 187 lb 9.6 oz (85.1 kg)   BMI 30.28 kg/m     Wt Readings from Last 3 Encounters:  07/02/23 187 lb 9.6 oz (85.1 kg)  08/23/22 175 lb 9.6 oz (79.7 kg)  01/24/22 169 lb 6.4 oz (76.8 kg)     GEN:  Well nourished, well developed in no acute distress HEENT: Normal NECK: No JVD; No carotid bruits LYMPHATICS: No lymphadenopathy CARDIAC: RRR, no murmurs, rubs, gallops RESPIRATORY:  Clear to auscultation without rales, wheezing or rhonchi  ABDOMEN: Soft, non-tender, non-distended MUSCULOSKELETAL:  No edema; No deformity  SKIN: Warm and dry NEUROLOGIC:  Alert and oriented x 3 PSYCHIATRIC:  Normal affect   ASSESSMENT:    1. Coronary artery disease involving native coronary artery of native heart without angina pectoris   2. Palpitations   3. SVT (supraventricular tachycardia) (HCC)   4. Hyperlipidemia LDL goal <70      PLAN:    CAD:  Underwent calcium   score on 10/12/2021, which was 464 (94th percentile).  Denies any chest pain but is reporting dyspnea.  Underwent exercise Myoview  on 10/23/2021, which showed good exercise capacity (10.1 METS), EF 62%, and normal perfusion; 2 mm horizontal ST depressions were noted during stress, which were thought to be likely false positive EKG stress in the setting of normal perfusion.  Echocardiogram 10/31/2021 showed normal biventricular function, no significant valvular disease.  Zio patch x5 days on 11/05/2021 showed 31 episodes of SVT, longest lasting 36 seconds with average rate 147 bpm, occasional PVCs (1% of beats). -Continue aspirin  81 mg daily -Continue Repatha   SVT: Reported palpitations, Zio patch x5 days  on 11/05/2021 showed 31 episodes of SVT, longest lasting 36 seconds with average rate 147 bpm, occasional PVCs (1% of beats). -Reports palpitations have resolved, continue Toprol -XL 25 mg daily  Hyperlipidemia: LDL 148 on 07/06/2021.  Calcium  score 464 on 10/12/2021 as above.  Tried both rosuvastatin and atorvastatin  but unable to tolerate due to myalgias.  She has been working on diet/exercise, LDL improved to 117 on 01/14/2022.  LDL remains above goal less than 70, referred to pharmacy lipid clinic to evaluate for PCSK9 inhibitor.  Started on Repatha , LDL improved to 66 on 08/02/22 -Check lipid panel  RTC in 1 year   Medication Adjustments/Labs and Tests Ordered: Current medicines are reviewed at length with the patient today.  Concerns regarding medicines are outlined above.  Orders Placed This Encounter  Procedures   EKG 12-Lead   No orders of the defined types were placed in this encounter.   There are no Patient Instructions on file for this visit.   Signed, Lonni LITTIE Nanas, MD  07/02/2023 8:29 AM    [facility]

## 2023-07-02 ENCOUNTER — Encounter: Payer: Self-pay | Admitting: Cardiology

## 2023-07-02 ENCOUNTER — Ambulatory Visit: Payer: PPO | Attending: Cardiology | Admitting: Cardiology

## 2023-07-02 VITALS — BP 116/65 | HR 74 | Ht 66.0 in | Wt 187.6 lb

## 2023-07-02 DIAGNOSIS — I2510 Atherosclerotic heart disease of native coronary artery without angina pectoris: Secondary | ICD-10-CM

## 2023-07-02 DIAGNOSIS — R002 Palpitations: Secondary | ICD-10-CM | POA: Diagnosis not present

## 2023-07-02 DIAGNOSIS — E785 Hyperlipidemia, unspecified: Secondary | ICD-10-CM

## 2023-07-02 DIAGNOSIS — I471 Supraventricular tachycardia, unspecified: Secondary | ICD-10-CM | POA: Diagnosis not present

## 2023-07-02 NOTE — Patient Instructions (Signed)
 Medication Instructions:  Continue all medications *If you need a refill on your cardiac medications before your next appointment, please call your pharmacy*   Lab Work: Lipid panel today   Testing/Procedures: None ordered   Follow-Up: At [facility], you and your health needs are our priority.  As part of our continuing mission to provide you with exceptional heart care, we have created designated Provider Care Teams.  These Care Teams include your primary Cardiologist (physician) and Advanced Practice Providers (APPs -  Physician Assistants and Nurse Practitioners) who all work together to provide you with the care you need, when you need it.  We recommend signing up for the patient portal called MyChart.  Sign up information is provided on this After Visit Summary.  MyChart is used to connect with patients for Virtual Visits (Telemedicine).  Patients are able to view lab/test results, encounter notes, upcoming appointments, etc.  Non-urgent messages can be sent to your provider as well.   To learn more about what you can do with MyChart, go to [email or web address].    Your next appointment:  1 year     Call in Oct to schedule Jan appointment     Provider:  Dr.Schumann

## 2023-07-03 LAB — LIPID PANEL
Chol/HDL Ratio: 2.2 {ratio} (ref 0.0–4.4)
Cholesterol, Total: 163 mg/dL (ref 100–199)
HDL: 74 mg/dL (ref >39)
LDL Chol Calc (NIH): 74 mg/dL (ref 0–99)
Triglycerides: 78 mg/dL (ref 0–149)
VLDL Cholesterol Cal: 15 mg/dL (ref 5–40)

## 2023-07-03 LAB — SPECIMEN STATUS REPORT

## 2023-07-11 DIAGNOSIS — M25561 Pain in right knee: Secondary | ICD-10-CM | POA: Diagnosis not present

## 2023-07-16 ENCOUNTER — Other Ambulatory Visit: Payer: Self-pay | Admitting: *Deleted

## 2023-07-16 DIAGNOSIS — R931 Abnormal findings on diagnostic imaging of heart and coronary circulation: Secondary | ICD-10-CM

## 2023-07-16 DIAGNOSIS — E785 Hyperlipidemia, unspecified: Secondary | ICD-10-CM

## 2023-07-16 MED ORDER — REPATHA SURECLICK 140 MG/ML ~~LOC~~ SOAJ: 140.0000 mg | SUBCUTANEOUS | 6 refills | Status: DC

## 2023-08-08 DIAGNOSIS — E7800 Pure hypercholesterolemia, unspecified: Secondary | ICD-10-CM | POA: Diagnosis not present

## 2023-08-08 DIAGNOSIS — I2510 Atherosclerotic heart disease of native coronary artery without angina pectoris: Secondary | ICD-10-CM | POA: Diagnosis not present

## 2023-08-15 DIAGNOSIS — Z0000 Encounter for general adult medical examination without abnormal findings: Secondary | ICD-10-CM | POA: Diagnosis not present

## 2023-08-15 DIAGNOSIS — R002 Palpitations: Secondary | ICD-10-CM | POA: Diagnosis not present

## 2023-08-15 DIAGNOSIS — R82998 Other abnormal findings in urine: Secondary | ICD-10-CM | POA: Diagnosis not present

## 2023-08-15 DIAGNOSIS — E663 Overweight: Secondary | ICD-10-CM | POA: Diagnosis not present

## 2023-08-15 DIAGNOSIS — Z1212 Encounter for screening for malignant neoplasm of rectum: Secondary | ICD-10-CM | POA: Diagnosis not present

## 2023-08-15 DIAGNOSIS — N6019 Diffuse cystic mastopathy of unspecified breast: Secondary | ICD-10-CM | POA: Diagnosis not present

## 2023-08-15 DIAGNOSIS — G720 Drug-induced myopathy: Secondary | ICD-10-CM | POA: Diagnosis not present

## 2023-08-15 DIAGNOSIS — T466X5A Adverse effect of antihyperlipidemic and antiarteriosclerotic drugs, initial encounter: Secondary | ICD-10-CM | POA: Diagnosis not present

## 2023-08-15 DIAGNOSIS — Z1331 Encounter for screening for depression: Secondary | ICD-10-CM | POA: Diagnosis not present

## 2023-08-15 DIAGNOSIS — I2584 Coronary atherosclerosis due to calcified coronary lesion: Secondary | ICD-10-CM | POA: Diagnosis not present

## 2023-08-15 DIAGNOSIS — G4700 Insomnia, unspecified: Secondary | ICD-10-CM | POA: Diagnosis not present

## 2023-08-15 DIAGNOSIS — E7800 Pure hypercholesterolemia, unspecified: Secondary | ICD-10-CM | POA: Diagnosis not present

## 2023-08-15 DIAGNOSIS — Z23 Encounter for immunization: Secondary | ICD-10-CM | POA: Diagnosis not present

## 2023-08-15 DIAGNOSIS — E785 Hyperlipidemia, unspecified: Secondary | ICD-10-CM | POA: Diagnosis not present

## 2023-08-15 DIAGNOSIS — I2510 Atherosclerotic heart disease of native coronary artery without angina pectoris: Secondary | ICD-10-CM | POA: Diagnosis not present

## 2023-09-03 ENCOUNTER — Other Ambulatory Visit: Payer: Self-pay | Admitting: Cardiology

## 2023-09-04 DIAGNOSIS — R42 Dizziness and giddiness: Secondary | ICD-10-CM | POA: Diagnosis not present

## 2023-09-04 DIAGNOSIS — I2510 Atherosclerotic heart disease of native coronary artery without angina pectoris: Secondary | ICD-10-CM | POA: Diagnosis not present

## 2023-09-04 DIAGNOSIS — H8112 Benign paroxysmal vertigo, left ear: Secondary | ICD-10-CM | POA: Diagnosis not present

## 2023-09-23 DIAGNOSIS — L814 Other melanin hyperpigmentation: Secondary | ICD-10-CM | POA: Diagnosis not present

## 2023-09-23 DIAGNOSIS — D1801 Hemangioma of skin and subcutaneous tissue: Secondary | ICD-10-CM | POA: Diagnosis not present

## 2023-09-23 DIAGNOSIS — D2239 Melanocytic nevi of other parts of face: Secondary | ICD-10-CM | POA: Diagnosis not present

## 2023-09-23 DIAGNOSIS — D2271 Melanocytic nevi of right lower limb, including hip: Secondary | ICD-10-CM | POA: Diagnosis not present

## 2023-09-23 DIAGNOSIS — L821 Other seborrheic keratosis: Secondary | ICD-10-CM | POA: Diagnosis not present

## 2023-09-23 DIAGNOSIS — D225 Melanocytic nevi of trunk: Secondary | ICD-10-CM | POA: Diagnosis not present

## 2023-10-17 DIAGNOSIS — Z1231 Encounter for screening mammogram for malignant neoplasm of breast: Secondary | ICD-10-CM | POA: Diagnosis not present

## 2024-01-19 ENCOUNTER — Encounter: Payer: Self-pay | Admitting: Cardiology

## 2024-01-22 NOTE — Telephone Encounter (Signed)
 Recommend patient be seen, she was added on for appointment on 8/6

## 2024-01-26 ENCOUNTER — Encounter (HOSPITAL_BASED_OUTPATIENT_CLINIC_OR_DEPARTMENT_OTHER): Payer: Self-pay

## 2024-01-27 NOTE — Progress Notes (Deleted)
 Cardiology Office Note:    Date:  01/27/2024   ID:  Kerry Monroe, DOB 1956/05/01, MRN [id number]  PCP:  Onita Rush, MD  Cardiologist:  Lonni LITTIE Nanas, MD  Electrophysiologist:  None   Referring MD: Onita Rush, MD   No chief complaint on file.   History of Present Illness:    Kerry Monroe is a 68 y.o. female with a hx of hyperlipidemia who presents for follow-up.  She was referred by Dr. Onita for evaluation of CAD, initially seen on 10/19/2021.  Underwent calcium  score on 10/12/2021, which was 464 (94th percentile).  She reports has been having episodes of lightheadedness and shortness of breath.  Denies any chest pain.  She works out twice per week with a Systems analyst, does weightlifting and core strengthening for 45 minutes.  Also walks for an hour 3-4 times per week.  Denies any symptoms with this.  Reports she will have sudden episodes of lightheadedness and shortness of breath.  Reports episodes of palpitations occurring every few months, last short duration.  Denies any lower extremity edema.  Has had syncopal episodes in the past when seeing blood but none recently.  No smoking history.  Family history includes father had MI in 62s and mother had CABG in 29s.  Underwent exercise Myoview  on 10/23/2021, which showed good exercise capacity (10.1 METS), EF 62%, and normal perfusion; 2 mm horizontal ST depressions were noted during stress, which were thought to be likely false positive EKG stress in the setting of normal perfusion.  Echocardiogram 10/31/2021 showed normal biventricular function, no significant valvular disease.  Zio patch x5 days on 11/05/2021 showed 31 episodes of SVT, longest lasting 36 seconds with average rate 147 bpm, occasional PVCs (1% of beats).  Since last clinic visit,  she reports she is doing well.  Reports palpitations have improved, now occurring about 1-2 times per month and just lasting for few seconds.  She is walking 2 to 3 days/week for  about an hour.  She also sees Systems analyst and does weightlifting.  She denies any exertional chest pain or dyspnea.  Reports occasional lightheadedness but denies any syncope.  Wt Readings from Last 3 Encounters:  07/02/23 187 lb 9.6 oz (85.1 kg)  08/23/22 175 lb 9.6 oz (79.7 kg)  01/24/22 169 lb 6.4 oz (76.8 kg)     Past Medical History:  Diagnosis Date   Abnormal mammogram 09/01/2012   NL excisional Bx > benign F/C changes    Medical history non-contributory    Seasonal allergies     Past Surgical History:  Procedure Laterality Date   BREAST BIOPSY Left 11/17/2012   Procedure: BREAST mass WITH NEEDLE LOCALIZATION;  Surgeon: Sherlean JINNY Laughter, MD;  Location: [facility];  Service: General;  Laterality: Left;   BREAST LUMPECTOMY Left    CESAREAN SECTION     x2   CHOLECYSTECTOMY  1991   DILATION AND CURETTAGE OF UTERUS  2008   uterine polyps and ablasion   HEMANGIOMA EXCISION  1991   [facility] hemangioma liver at [facility]    TONSILLECTOMY      Current Medications: No outpatient medications have been marked as taking for the 01/28/24 encounter (Appointment) with Nanas Lonni LITTIE, MD.     Allergies:   Penicillins and Rosuvastatin   Social History   Socioeconomic History   Marital status: Married    Spouse name: Not on file   Number of children: Not on file   Years of education: Not  on file   Highest education level: Not on file  Occupational History   Not on file  Tobacco Use   Smoking status: Never   Smokeless tobacco: Never  Substance and Sexual Activity   Alcohol  use: No   Drug use: No   Sexual activity: Not on file  Other Topics Concern   Not on file  Social History Narrative   Not on file   Social Drivers of Health   Financial Resource Strain: Not on file  Food Insecurity: Not on file  Transportation Needs: Not on file  Physical Activity: Not on file  Stress: Not on file  Social Connections: Unknown (11/05/2021)   Received from  [facility]   Social Network    Social Network: Not on file     Family History: The patient's family history includes Cancer in her sister; Heart disease in her father. There is no history of Breast cancer.  ROS:   Please see the history of present illness.     All other systems reviewed and are negative.  EKGs/Labs/Other Studies Reviewed:    The following studies were reviewed today:   EKG:   10/19/21: Normal sinus rhythm, rate 80, nonspecific T wave flattening 08/23/22: NSR, rate 64, nonspecific T wave flattening 07/02/2023: Normal sinus rhythm, rate 72, low voltage  Recent Labs: No results found for requested labs within last 365 days.  Recent Lipid Panel    Component Value Date/Time   CHOL 163 07/02/2023 0000   TRIG 78 07/02/2023 0000   HDL 74 07/02/2023 0000   CHOLHDL 2.2 07/02/2023 0000   LDLCALC 74 07/02/2023 0000    Physical Exam:    VS:  There were no vitals taken for this visit.    Wt Readings from Last 3 Encounters:  07/02/23 187 lb 9.6 oz (85.1 kg)  08/23/22 175 lb 9.6 oz (79.7 kg)  01/24/22 169 lb 6.4 oz (76.8 kg)     GEN:  Well nourished, well developed in no acute distress HEENT: Normal NECK: No JVD; No carotid bruits LYMPHATICS: No lymphadenopathy CARDIAC: RRR, no murmurs, rubs, gallops RESPIRATORY:  Clear to auscultation without rales, wheezing or rhonchi  ABDOMEN: Soft, non-tender, non-distended MUSCULOSKELETAL:  No edema; No deformity  SKIN: Warm and dry NEUROLOGIC:  Alert and oriented x 3 PSYCHIATRIC:  Normal affect   ASSESSMENT:    No diagnosis found.    PLAN:    CAD:  Underwent calcium  score on 10/12/2021, which was 464 (94th percentile).  Denies any chest pain but is reporting dyspnea.  Underwent exercise Myoview  on 10/23/2021, which showed good exercise capacity (10.1 METS), EF 62%, and normal perfusion; 2 mm horizontal ST depressions were noted during stress, which were thought to be likely false positive EKG stress in the setting of  normal perfusion.  Echocardiogram 10/31/2021 showed normal biventricular function, no significant valvular disease.  Zio patch x5 days on 11/05/2021 showed 31 episodes of SVT, longest lasting 36 seconds with average rate 147 bpm, occasional PVCs (1% of beats). -Continue aspirin  81 mg daily -Continue Repatha   Leg pain: Check ABIs***  SVT: Reported palpitations, Zio patch x5 days on 11/05/2021 showed 31 episodes of SVT, longest lasting 36 seconds with average rate 147 bpm, occasional PVCs (1% of beats). -Reports palpitations have resolved, continue Toprol -XL 25 mg daily  Hyperlipidemia: LDL 148 on 07/06/2021.  Calcium  score 464 on 10/12/2021 as above.  Tried both rosuvastatin and atorvastatin  but unable to tolerate due to myalgias.  She has been working on diet/exercise, LDL  improved to 117 on 01/14/2022.  LDL remains above goal less than 70, referred to pharmacy lipid clinic to evaluate for PCSK9 inhibitor.  Started on Repatha , LDL improved to 66 on 08/02/22 -Check lipid panel  RTC in 1 year***   Medication Adjustments/Labs and Tests Ordered: Current medicines are reviewed at length with the patient today.  Concerns regarding medicines are outlined above.  No orders of the defined types were placed in this encounter.  No orders of the defined types were placed in this encounter.   There are no Patient Instructions on file for this visit.   Signed, Lonni LITTIE Nanas, MD  01/27/2024 1:18 PM    [facility]

## 2024-01-28 ENCOUNTER — Ambulatory Visit: Admitting: Cardiology

## 2024-01-28 ENCOUNTER — Encounter: Payer: Self-pay | Admitting: Cardiology

## 2024-01-28 ENCOUNTER — Ambulatory Visit: Attending: Cardiology | Admitting: Cardiology

## 2024-01-28 VITALS — BP 125/77 | HR 61 | Ht 66.0 in | Wt 188.8 lb

## 2024-01-28 DIAGNOSIS — I2510 Atherosclerotic heart disease of native coronary artery without angina pectoris: Secondary | ICD-10-CM

## 2024-01-28 DIAGNOSIS — M79604 Pain in right leg: Secondary | ICD-10-CM | POA: Diagnosis not present

## 2024-01-28 DIAGNOSIS — E785 Hyperlipidemia, unspecified: Secondary | ICD-10-CM

## 2024-01-28 DIAGNOSIS — I471 Supraventricular tachycardia, unspecified: Secondary | ICD-10-CM | POA: Diagnosis not present

## 2024-01-28 DIAGNOSIS — I10 Essential (primary) hypertension: Secondary | ICD-10-CM

## 2024-01-28 DIAGNOSIS — M79605 Pain in left leg: Secondary | ICD-10-CM | POA: Diagnosis not present

## 2024-01-28 NOTE — Patient Instructions (Addendum)
 Medication Instructions:  Continue current medications Please hold your Repatha  for now as directed bu your provider *If you need a refill on your cardiac medications before your next appointment, please call your pharmacy*  Lab Work: Bmet, ck, vit d, B12, TSH If you have labs (blood work) drawn today and your tests are completely normal, you will receive your results only by: MyChart Message (if you have MyChart) OR A paper copy in the mail If you have any lab test that is abnormal or we need to change your treatment, we will call you to review the results.  Testing/Procedures: Kerry Monroe  Your physician has requested that you have an ankle brachial index (ABI). During this test an ultrasound and blood pressure cuff are used to evaluate the arteries that supply the arms and legs with blood. Allow thirty minutes for this exam. There are no restrictions or special instructions. This will take place at [address] 4th floor   Please note: We ask at that you not bring children with you during ultrasound (echo/ vascular) testing. Due to room size and safety concerns, children are not allowed in the ultrasound rooms during exams. Our front office staff cannot provide observation of children in our lobby area while testing is being conducted. An adult accompanying a patient to their appointment will only be allowed in the ultrasound room at the discretion of the ultrasound technician under special circumstances. We apologize for any inconvenience.   Follow-Up: At [facility], you and your health needs are our priority.  As part of our continuing mission to provide you with exceptional heart care, our providers are all part of one team.  This team includes your primary Cardiologist (physician) and Advanced Practice Providers or APPs (Physician Assistants and Nurse Practitioners) who all work together to provide you with the care you need, when you need it.  Your next appointment:   4  months  Provider:   Dr. Kate  We recommend signing up for the patient portal called MyChart.  Sign up information is provided on this After Visit Summary.  MyChart is used to connect with patients for Virtual Visits (Telemedicine).  Patients are able to view lab/test results, encounter notes, upcoming appointments, etc.  Non-urgent messages can be sent to your provider as well.   To learn more about what you can do with MyChart, go to [email or web address].   Other Instructions Please hold Repatha  for now  Referral to lipid Clinic 1-2 months

## 2024-01-28 NOTE — Progress Notes (Signed)
 Cardiology Office Note:    Date:  01/28/2024   ID:  Kerry Monroe, DOB 1956/04/19, MRN [id number]  PCP:  Onita Rush, MD  Cardiologist:  Lonni LITTIE Nanas, MD  Electrophysiologist:  None   Referring MD: Onita Rush, MD   Chief Complaint  Patient presents with   Coronary Artery Disease    History of Present Illness:    Kerry Monroe is a 68 y.o. female with a hx of hyperlipidemia who presents for follow-up.  She was referred by Dr. Onita for evaluation of CAD, initially seen on 10/19/2021.  Underwent calcium  score on 10/12/2021, which was 464 (94th percentile).  She reports has been having episodes of lightheadedness and shortness of breath.  Denies any chest pain.  She works out twice per week with a Systems analyst, does weightlifting and core strengthening for 45 minutes.  Also walks for an hour 3-4 times per week.  Denies any symptoms with this.  Reports she will have sudden episodes of lightheadedness and shortness of breath.  Reports episodes of palpitations occurring every few months, last short duration.  Denies any lower extremity edema.  Has had syncopal episodes in the past when seeing blood but none recently.  No smoking history.  Family history includes father had MI in 42s and mother had CABG in 38s.  Underwent exercise Myoview  on 10/23/2021, which showed good exercise capacity (10.1 METS), EF 62%, and normal perfusion; 2 mm horizontal ST depressions were noted during stress, which were thought to be likely false positive EKG stress in the setting of normal perfusion.  Echocardiogram 10/31/2021 showed normal biventricular function, no significant valvular disease.  Zio patch x5 days on 11/05/2021 showed 31 episodes of SVT, longest lasting 36 seconds with average rate 147 bpm, occasional PVCs (1% of beats).  Since last clinic visit, she reports she has been having leg pain.  States that she feels leg pain when she stands up but legs also ache at night and when walking  upstairs.  She denies any chest pain or dyspnea.  Wt Readings from Last 3 Encounters:  01/28/24 188 lb 12.8 oz (85.6 kg)  07/02/23 187 lb 9.6 oz (85.1 kg)  08/23/22 175 lb 9.6 oz (79.7 kg)     Past Medical History:  Diagnosis Date   Abnormal mammogram 09/01/2012   NL excisional Bx > benign F/C changes    Medical history non-contributory    Seasonal allergies     Past Surgical History:  Procedure Laterality Date   BREAST BIOPSY Left 11/17/2012   Procedure: BREAST mass WITH NEEDLE LOCALIZATION;  Surgeon: Sherlean JINNY Laughter, MD;  Location: [facility];  Service: General;  Laterality: Left;   BREAST LUMPECTOMY Left    CESAREAN SECTION     x2   CHOLECYSTECTOMY  1991   DILATION AND CURETTAGE OF UTERUS  2008   uterine polyps and ablasion   HEMANGIOMA EXCISION  1991   [facility] hemangioma liver at [facility]    TONSILLECTOMY      Current Medications: Current Meds  Medication Sig   aspirin  EC 81 MG tablet Take 1 tablet (81 mg total) by mouth daily. Swallow whole.   Evolocumab  (REPATHA  SURECLICK) 140 MG/ML SOAJ Inject 140 mg into the skin every 14 (fourteen) days.   metoprolol  succinate (TOPROL -XL) 25 MG 24 hr tablet TAKE 1 TABLET BY MOUTH DAILY     Allergies:   Penicillins and Rosuvastatin   Social History   Socioeconomic History   Marital status: Married  Spouse name: Not on file   Number of children: Not on file   Years of education: Not on file   Highest education level: Not on file  Occupational History   Not on file  Tobacco Use   Smoking status: Never   Smokeless tobacco: Never  Substance and Sexual Activity   Alcohol  use: No   Drug use: No   Sexual activity: Not on file  Other Topics Concern   Not on file  Social History Narrative   Not on file   Social Drivers of Health   Financial Resource Strain: Not on file  Food Insecurity: Not on file  Transportation Needs: Not on file  Physical Activity: Not on file  Stress: Not on file  Social  Connections: Unknown (11/05/2021)   Received [facility]   Social Network    Social Network: Not on file     Family History: The patient's family history includes Cancer in her sister; Heart disease in her father. There is no history of Breast cancer.  ROS:   Please see the history of present illness.     All other systems reviewed and are negative.  EKGs/Labs/Other Studies Reviewed:    The following studies were reviewed today:   EKG:   10/19/21: Normal sinus rhythm, rate 80, nonspecific T wave flattening 08/23/22: NSR, rate 64, nonspecific T wave flattening 07/02/2023: Normal sinus rhythm, rate 72, low voltage  Recent Labs: No results found for requested labs within last 365 days.  Recent Lipid Panel    Component Value Date/Time   CHOL 163 07/02/2023 0000   TRIG 78 07/02/2023 0000   HDL 74 07/02/2023 0000   CHOLHDL 2.2 07/02/2023 0000   LDLCALC 74 07/02/2023 0000    Physical Exam:    VS:  BP 125/77   Pulse 61   Ht 5' 6 (1.676 m)   Wt 188 lb 12.8 oz (85.6 kg)   SpO2 99%   BMI 30.47 kg/m     Wt Readings from Last 3 Encounters:  01/28/24 188 lb 12.8 oz (85.6 kg)  07/02/23 187 lb 9.6 oz (85.1 kg)  08/23/22 175 lb 9.6 oz (79.7 kg)     GEN:  Well nourished, well developed in no acute distress HEENT: Normal NECK: No JVD; No carotid bruits LYMPHATICS: No lymphadenopathy CARDIAC: RRR, no murmurs, rubs, gallops RESPIRATORY:  Clear to auscultation without rales, wheezing or rhonchi  ABDOMEN: Soft, non-tender, non-distended MUSCULOSKELETAL:  No edema; No deformity  SKIN: Warm and dry NEUROLOGIC:  Alert and oriented x 3 PSYCHIATRIC:  Normal affect   ASSESSMENT:    1. Pain in both lower extremities   2. Coronary artery disease involving native coronary artery of native heart without angina pectoris   3. SVT (supraventricular tachycardia) (HCC)   4. Hyperlipidemia, unspecified hyperlipidemia type   5. Essential hypertension     PLAN:    Leg pain: Unclear  cause.  Reports had severe leg pain when on statins, improved when she stopped statins but has continued to have pain on Repatha .  Will check ABIs to rule out PAD.  Check electrolytes, vitamin D , CK, TSH, B12.  If workup unremarkable, could be due to to Repatha .  Would suggest holding Repatha  for now to see if symptoms improve, and will follow-up in pharmacy lipid clinic  CAD:  Underwent calcium  score on 10/12/2021, which was 464 (94th percentile).  Denies any chest pain but is reporting dyspnea.  Underwent exercise Myoview  on 10/23/2021, which showed good exercise capacity (  10.1 METS), EF 62%, and normal perfusion; 2 mm horizontal ST depressions were noted during stress, which were thought to be likely false positive EKG stress in the setting of normal perfusion.  Echocardiogram 10/31/2021 showed normal biventricular function, no significant valvular disease.  Zio patch x5 days on 11/05/2021 showed 31 episodes of SVT, longest lasting 36 seconds with average rate 147 bpm, occasional PVCs (1% of beats). -Continue aspirin  81 mg daily -Continue Repatha   SVT: Reported palpitations, Zio patch x5 days on 11/05/2021 showed 31 episodes of SVT, longest lasting 36 seconds with average rate 147 bpm, occasional PVCs (1% of beats). -Reports palpitations have resolved, continue Toprol -XL 25 mg daily  Hyperlipidemia: LDL 148 on 07/06/2021.  Calcium  score 464 on 10/12/2021 as above.  Tried both rosuvastatin and atorvastatin  but unable to tolerate due to myalgias.  She has been working on diet/exercise, LDL improved to 117 on 01/14/2022.  LDL remains above goal less than 70, referred to pharmacy lipid clinic to evaluate for PCSK9 inhibitor.  Started on Repatha , LDL 68 on 08/19/2023.  Hold Repatha  for now and follow-up in pharmacy lipid clinic as above  RTC in 4 months   Medication Adjustments/Labs and Tests Ordered: Current medicines are reviewed at length with the patient today.  Concerns regarding medicines are outlined  above.  Orders Placed This Encounter  Procedures   Basic Metabolic Panel (BMET)   CK (Creatine Kinase)   Vitamin D  (25 hydroxy)   Vitamin B12   TSH   AMB Referral to [facility] Pharm-D   VAS US  ABI WITH/WO TBI   No orders of the defined types were placed in this encounter.   Patient Instructions  Medication Instructions:  Continue current medications Please hold your Repatha  for now as directed bu your provider *If you need a refill on your cardiac medications before your next appointment, please call your pharmacy*  Lab Work: Bmet, ck, vit d, B12, TSH If you have labs (blood work) drawn today and your tests are completely normal, you will receive your results only by: MyChart Message (if you have MyChart) OR A paper copy in the mail If you have any lab test that is abnormal or we need to change your treatment, we will call you to review the results.  Testing/Procedures: Danita  Your physician has requested that you have an ankle brachial index (ABI). During this test an ultrasound and blood pressure cuff are used to evaluate the arteries that supply the arms and legs with blood. Allow thirty minutes for this exam. There are no restrictions or special instructions. This will take place at [address], 4th floor   Please note: We ask at that you not bring children with you during ultrasound (echo/ vascular) testing. Due to room size and safety concerns, children are not allowed in the ultrasound rooms during exams. Our front office staff cannot provide observation of children in our lobby area while testing is being conducted. An adult accompanying a patient to their appointment will only be allowed in the ultrasound room at the discretion of the ultrasound technician under special circumstances. We apologize for any inconvenience.   Follow-Up: At [facility], you and your health needs are our priority.  As part of our continuing mission to provide you with exceptional  heart care, our providers are all part of one team.  This team includes your primary Cardiologist (physician) and Advanced Practice Providers or APPs (Physician Assistants and Nurse Practitioners) who all work together to provide you with the  care you need, when you need it.  Your next appointment:   4 months  Provider:   Dr. Kate  We recommend signing up for the patient portal called MyChart.  Sign up information is provided on this After Visit Summary.  MyChart is used to connect with patients for Virtual Visits (Telemedicine).  Patients are able to view lab/test results, encounter notes, upcoming appointments, etc.  Non-urgent messages can be sent to your provider as well.   To learn more about what you can do with MyChart, go to [email or web address].   Other Instructions Please hold Repatha  for now  Referral to lipid Clinic 1-2 months       Signed, Lonni LITTIE Kate, MD  01/28/2024 11:39 AM    [facility]

## 2024-01-29 ENCOUNTER — Ambulatory Visit: Payer: Self-pay | Admitting: Cardiology

## 2024-01-29 LAB — TSH: TSH: 3.05 u[IU]/mL (ref 0.450–4.500)

## 2024-01-29 LAB — CK: Total CK: 87 U/L (ref 32–182)

## 2024-01-29 LAB — BASIC METABOLIC PANEL WITH GFR
BUN/Creatinine Ratio: 22 (ref 12–28)
BUN: 19 mg/dL (ref 8–27)
CO2: 24 mmol/L (ref 20–29)
Calcium: 10.1 mg/dL (ref 8.7–10.3)
Chloride: 101 mmol/L (ref 96–106)
Creatinine, Ser: 0.88 mg/dL (ref 0.57–1.00)
Glucose: 90 mg/dL (ref 70–99)
Potassium: 4.5 mmol/L (ref 3.5–5.2)
Sodium: 141 mmol/L (ref 134–144)
eGFR: 71 mL/min/1.73 (ref >59)

## 2024-01-29 LAB — VITAMIN D 25 HYDROXY (VIT D DEFICIENCY, FRACTURES): Vit D, 25-Hydroxy: 43 ng/mL (ref 30.0–100.0)

## 2024-01-29 LAB — VITAMIN B12: Vitamin B-12: 482 pg/mL (ref 232–1245)

## 2024-02-06 ENCOUNTER — Ambulatory Visit (HOSPITAL_COMMUNITY)
Admission: RE | Admit: 2024-02-06 | Discharge: 2024-02-06 | Disposition: A | Discharge status: Home / Self Care | Source: Ambulatory Visit | Attending: Cardiology | Admitting: Cardiology

## 2024-02-06 DIAGNOSIS — M79604 Pain in right leg: Secondary | ICD-10-CM | POA: Insufficient documentation

## 2024-02-06 DIAGNOSIS — M79605 Pain in left leg: Secondary | ICD-10-CM | POA: Diagnosis not present

## 2024-02-06 LAB — VAS US ABI WITH/WO TBI
Left ABI: 1.23
Right ABI: 1.28

## 2024-03-02 NOTE — Progress Notes (Unsigned)
 Patient ID: Kerry Monroe                 DOB: May 07, 1956                    MRN: [id number]      HPI: Kerry Monroe is a 68 y.o. female patient referred to lipid clinic by Dr. Kate. PMH is significant for CAD (coronary calcium : 464 on 09/2021), HLD, and HTN.  Patient presents to lipid clinic.  Experiencing any leg pain? 1 mo post Repatha  hold  LDL at goal for now Alternatives: ezetimibe, bempedoic acid, and inclisiran?  Reviewed options for lowering LDL cholesterol, including ezetimibe, PCSK-9 inhibitors, bempedoic acid and inclisiran.  Discussed mechanisms of action, dosing, side effects and potential decreases in LDL cholesterol.  Also reviewed cost information and potential options for patient assistance.  Current Medications: Repatha  140 mg subcutaneously every two weeks (on hold) Intolerances: rosuvastatin, atorvastatin  Risk Factors: CAD, HTN LDL goal: <100 mg/dL; <29 per provider ASCVD risk score: 6.9% Most recent lipid panel (07/2023; [facility]): Chol 162; Trig 85; HDL 78; LDL 68  Diet:   Exercise:   Family History:  Relation Problem Comments  Mother Metallurgist)   Father (Deceased) Heart disease     Sister (Deceased) Cancer adenocarcinoma of appendix    Neg Hx Breast cancer       Social History:  Alcohol : Smoking:  Labs:  Lipid Panel     Component Value Date/Time   CHOL 163 07/02/2023 0000   TRIG 78 07/02/2023 0000   HDL 74 07/02/2023 0000   CHOLHDL 2.2 07/02/2023 0000   LDLCALC 74 07/02/2023 0000   LABVLDL 15 07/02/2023 0000    Past Medical History:  Diagnosis Date   Abnormal mammogram 09/01/2012   NL excisional Bx > benign F/C changes    Medical history non-contributory    Seasonal allergies     Current Outpatient Medications on File Prior to Visit  Medication Sig Dispense Refill   aspirin  EC 81 MG tablet Take 1 tablet (81 mg total) by mouth daily. Swallow whole. 90 tablet 3   Evolocumab  (REPATHA  SURECLICK) 140 MG/ML SOAJ Inject 140  mg into the skin every 14 (fourteen) days. 1 mL 6   metoprolol  succinate (TOPROL -XL) 25 MG 24 hr tablet TAKE 1 TABLET BY MOUTH DAILY 90 tablet 3   No current facility-administered medications on file prior to visit.    Allergies  Allergen Reactions   Penicillins Anaphylaxis   Rosuvastatin Other (See Comments)    Body aches    Assessment/Plan:  1. Hyperlipidemia -  No problems updated. No problem-specific Assessment & Plan notes found for this encounter.    Thank you,  Lakedra E. White, Pharm.D [facility]. [facility] [address], [place], [place] [address] Phone: [phone]; Fax: [phone]

## 2024-03-03 ENCOUNTER — Telehealth: Payer: Self-pay

## 2024-03-03 ENCOUNTER — Other Ambulatory Visit: Payer: Self-pay

## 2024-03-03 ENCOUNTER — Ambulatory Visit: Attending: Cardiovascular Disease | Admitting: Pharmacist Clinician (PhC)/ Clinical Pharmacy Specialist

## 2024-03-03 ENCOUNTER — Encounter: Payer: Self-pay | Admitting: Pharmacist Clinician (PhC)/ Clinical Pharmacy Specialist

## 2024-03-03 DIAGNOSIS — E785 Hyperlipidemia, unspecified: Secondary | ICD-10-CM

## 2024-03-03 NOTE — Patient Instructions (Signed)
 Your Results:             Your most recent labs Goal  Total Cholesterol 162 < 200  Triglycerides 85 < 150  HDL (happy/good cholesterol) 78 > 40  LDL (lousy/bad cholesterol 69 < 70   Continue to hold Repatha   Repeat Lipid panel in 2 months  Medication alternatives to Repatha : Ezetimibe - oral tablet, lowers LDL 20% Bempedoic acid - oral tablet, lowers LDL 17% *Leqvio - subcutaneous injection, lowers LDL 50% (will require you to go to the infusion center; dose schedule: first injection, 3 months and every 6 months thereafter)

## 2024-03-03 NOTE — Assessment & Plan Note (Signed)
 Assessment:  LDL goal: < 70 mg/dl last LDLc 68 mg/dl (97/7974) Intolerance to Repatha , atorvastatin  and rosuvastatin (leg pain) Discussed next potential options (ezetimibe, bempedoic acid and inclisiran); dosing efficacy, side effects    Plan: Continue to hold Repatha  and incorporate lifestyle changes Repeat lipid panel in 2 months following lifestyle modifications (diet and exercise) Will assess the need to restart Repatha  or explore other alternatives once lipid panel results

## 2024-03-03 NOTE — Telephone Encounter (Signed)
Reminder for labs

## 2024-03-31 ENCOUNTER — Encounter: Payer: Self-pay | Admitting: Pharmacist Clinician (PhC)/ Clinical Pharmacy Specialist

## 2024-03-31 DIAGNOSIS — R931 Abnormal findings on diagnostic imaging of heart and coronary circulation: Secondary | ICD-10-CM

## 2024-03-31 DIAGNOSIS — E785 Hyperlipidemia, unspecified: Secondary | ICD-10-CM

## 2024-04-16 DIAGNOSIS — Z961 Presence of intraocular lens: Secondary | ICD-10-CM | POA: Diagnosis not present

## 2024-04-16 DIAGNOSIS — H26493 Other secondary cataract, bilateral: Secondary | ICD-10-CM | POA: Diagnosis not present

## 2024-04-16 DIAGNOSIS — H52203 Unspecified astigmatism, bilateral: Secondary | ICD-10-CM | POA: Diagnosis not present

## 2024-04-23 DIAGNOSIS — E785 Hyperlipidemia, unspecified: Secondary | ICD-10-CM | POA: Diagnosis not present

## 2024-04-23 LAB — LIPID PANEL
Chol/HDL Ratio: 3.1 ratio (ref 0.0–4.4)
Cholesterol, Total: 214 mg/dL — ABNORMAL HIGH (ref 100–199)
HDL: 69 mg/dL (ref >39)
LDL Chol Calc (NIH): 132 mg/dL — ABNORMAL HIGH (ref 0–99)
Triglycerides: 71 mg/dL (ref 0–149)
VLDL Cholesterol Cal: 13 mg/dL (ref 5–40)

## 2024-04-26 ENCOUNTER — Other Ambulatory Visit (HOSPITAL_COMMUNITY): Payer: Self-pay

## 2024-04-26 MED ORDER — REPATHA SURECLICK 140 MG/ML ~~LOC~~ SOAJ: 140.0000 mg | SUBCUTANEOUS | 3 refills | Status: AC

## 2024-04-26 MED ORDER — REPATHA SURECLICK 140 MG/ML ~~LOC~~ SOAJ
140.0000 mg | SUBCUTANEOUS | 3 refills | Status: DC
  Filled 2024-04-26: qty 6, 84d supply, fill #0

## 2024-04-28 ENCOUNTER — Ambulatory Visit: Payer: Self-pay | Admitting: Pharmacist Clinician (PhC)/ Clinical Pharmacy Specialist

## 2024-04-28 ENCOUNTER — Encounter: Payer: Self-pay | Admitting: Pharmacist Clinician (PhC)/ Clinical Pharmacy Specialist

## 2024-04-28 DIAGNOSIS — E785 Hyperlipidemia, unspecified: Secondary | ICD-10-CM

## 2024-05-12 DIAGNOSIS — H26492 Other secondary cataract, left eye: Secondary | ICD-10-CM | POA: Diagnosis not present

## 2024-06-10 NOTE — Progress Notes (Unsigned)
 Cardiology Office Note:    Date:  06/10/2024   ID:  FELIX PRATT, DOB 01-May-1956, MRN [id number]  PCP:  Onita Rush, MD  Cardiologist:  Lonni LITTIE Nanas, MD  Electrophysiologist:  None   Referring MD: Onita Rush, MD   No chief complaint on file.   History of Present Illness:    Kerry Monroe is a 68 y.o. female with a hx of hyperlipidemia who presents for follow-up.  She was referred by Dr. Onita for evaluation of CAD, initially seen on 10/19/2021.  Underwent calcium  score on 10/12/2021, which was 464 (94th percentile).  She reports has been having episodes of lightheadedness and shortness of breath.  Denies any chest pain.  She works out twice per week with a systems analyst, does weightlifting and core strengthening for 45 minutes.  Also walks for an hour 3-4 times per week.  Denies any symptoms with this.  Reports she will have sudden episodes of lightheadedness and shortness of breath.  Reports episodes of palpitations occurring every few months, last short duration.  Denies any lower extremity edema.  Has had syncopal episodes in the past when seeing blood but none recently.  No smoking history.  Family history includes father had MI in 18s and mother had CABG in 74s.  Underwent exercise Myoview  on 10/23/2021, which showed good exercise capacity (10.1 METS), EF 62%, and normal perfusion; 2 mm horizontal ST depressions were noted during stress, which were thought to be likely false positive EKG stress in the setting of normal perfusion.  Echocardiogram 10/31/2021 showed normal biventricular function, no significant valvular disease.  Zio patch x5 days on 11/05/2021 showed 31 episodes of SVT, longest lasting 36 seconds with average rate 147 bpm, occasional PVCs (1% of beats).  Since last clinic visit,  she reports she has been having leg pain.  States that she feels leg pain when she stands up but legs also ache at night and when walking upstairs.  She denies any chest pain or  dyspnea.  Wt Readings from Last 3 Encounters:  01/28/24 188 lb 12.8 oz (85.6 kg)  07/02/23 187 lb 9.6 oz (85.1 kg)  08/23/22 175 lb 9.6 oz (79.7 kg)     Past Medical History:  Diagnosis Date   Abnormal mammogram 09/01/2012   NL excisional Bx > benign F/C changes    Medical history non-contributory    Seasonal allergies     Past Surgical History:  Procedure Laterality Date   BREAST BIOPSY Left 11/17/2012   Procedure: BREAST mass WITH NEEDLE LOCALIZATION;  Surgeon: Sherlean JINNY Laughter, MD;  Location: [facility];  Service: General;  Laterality: Left;   BREAST LUMPECTOMY Left    CESAREAN SECTION     x2   CHOLECYSTECTOMY  1991   DILATION AND CURETTAGE OF UTERUS  2008   uterine polyps and ablasion   HEMANGIOMA EXCISION  1991   [facility] hemangioma liver at [facility]    TONSILLECTOMY      Current Medications: No outpatient medications have been marked as taking for the 06/11/24 encounter (Appointment) with Nanas Lonni LITTIE, MD.     Allergies:   Penicillins and Rosuvastatin   Social History   Socioeconomic History   Marital status: Married    Spouse name: Not on file   Number of children: Not on file   Years of education: Not on file   Highest education level: Not on file  Occupational History   Not on file  Tobacco Use   Smoking status:  Never   Smokeless tobacco: Never  Substance and Sexual Activity   Alcohol  use: No   Drug use: No   Sexual activity: Not on file  Other Topics Concern   Not on file  Social History Narrative   Not on file   Social Drivers of Health   Tobacco Use: Low Risk (03/03/2024)   Patient History    Smoking Tobacco Use: Never    Smokeless Tobacco Use: Never    Passive Exposure: Not on file  Financial Resource Strain: Not on file  Food Insecurity: Not on file  Transportation Needs: Not on file  Physical Activity: Not on file  Stress: Not on file  Social Connections: Not on file  Depression (EYV7-0): Not on file   Alcohol  Screen: Not on file  Housing: Not on file  Utilities: Not on file  Health Literacy: Not on file     Family History: The patient's family history includes Cancer in her sister; Heart disease in her father. There is no history of Breast cancer.  ROS:   Please see the history of present illness.     All other systems reviewed and are negative.  EKGs/Labs/Other Studies Reviewed:    The following studies were reviewed today:   EKG:   10/19/21: Normal sinus rhythm, rate 80, nonspecific T wave flattening 08/23/22: NSR, rate 64, nonspecific T wave flattening 07/02/2023: Normal sinus rhythm, rate 72, low voltage  Recent Labs: 01/28/2024: BUN 19; Creatinine, Ser 0.88; Potassium 4.5; Sodium 141; TSH 3.050  Recent Lipid Panel    Component Value Date/Time   CHOL 214 (H) 04/23/2024 0848   TRIG 71 04/23/2024 0848   HDL 69 04/23/2024 0848   CHOLHDL 3.1 04/23/2024 0848   LDLCALC 132 (H) 04/23/2024 0848    Physical Exam:    VS:  There were no vitals taken for this visit.    Wt Readings from Last 3 Encounters:  01/28/24 188 lb 12.8 oz (85.6 kg)  07/02/23 187 lb 9.6 oz (85.1 kg)  08/23/22 175 lb 9.6 oz (79.7 kg)     GEN:  Well nourished, well developed in no acute distress HEENT: Normal NECK: No JVD; No carotid bruits LYMPHATICS: No lymphadenopathy CARDIAC: RRR, no murmurs, rubs, gallops RESPIRATORY:  Clear to auscultation without rales, wheezing or rhonchi  ABDOMEN: Soft, non-tender, non-distended MUSCULOSKELETAL:  No edema; No deformity  SKIN: Warm and dry NEUROLOGIC:  Alert and oriented x 3 PSYCHIATRIC:  Normal affect   ASSESSMENT:    No diagnosis found.   PLAN:    Leg pain: Unclear cause.  Reports had severe leg pain when on statins, improved when she stopped statins but has continued to have pain on Repatha .  Normal ABIs 01/2024.  Electrolytes, vitamin D , CK, TSH, B12 unremarkable 01/2024.  To trial off Repatha  but continued to have pain.  She reports she noticed  symptoms improved with stopping metoprolol ***  CAD:  Underwent calcium  score on 10/12/2021, which was 464 (94th percentile).  Denies any chest pain but is reporting dyspnea.  Underwent exercise Myoview  on 10/23/2021, which showed good exercise capacity (10.1 METS), EF 62%, and normal perfusion; 2 mm horizontal ST depressions were noted during stress, which were thought to be likely false positive EKG stress in the setting of normal perfusion.  Echocardiogram 10/31/2021 showed normal biventricular function, no significant valvular disease.  Zio patch x5 days on 11/05/2021 showed 31 episodes of SVT, longest lasting 36 seconds with average rate 147 bpm, occasional PVCs (1% of beats). -Continue aspirin  81 mg  daily -Continue Repatha   SVT: Reported palpitations, Zio patch x5 days on 11/05/2021 showed 31 episodes of SVT, longest lasting 36 seconds with average rate 147 bpm, occasional PVCs (1% of beats). -Reports palpitations have resolved.  She was started on metoprolol  but currently holding***  Hyperlipidemia: LDL 148 on 07/06/2021.  Calcium  score 464 on 10/12/2021 as above.  Tried both rosuvastatin and atorvastatin  but unable to tolerate due to myalgias.  She has been working on diet/exercise, LDL improved to 117 on 01/14/2022.  LDL remains above goal less than 70, referred to pharmacy lipid clinic to evaluate for PCSK9 inhibitor.  Started on Repatha , LDL 68 on 08/19/2023.   - She stopped Repatha  due to leg pain as above, LDL increased to 132 on 04/23/2024.  She is now back on Repatha , plan repeat lipid panel next month***  RTC in 4 months***   Medication Adjustments/Labs and Tests Ordered: Current medicines are reviewed at length with the patient today.  Concerns regarding medicines are outlined above.  No orders of the defined types were placed in this encounter.  No orders of the defined types were placed in this encounter.   There are no Patient Instructions on file for this visit.    Signed, Lonni LITTIE Nanas, MD  06/10/2024 4:08 PM    [facility]

## 2024-06-11 ENCOUNTER — Ambulatory Visit: Attending: Cardiology | Admitting: Cardiology

## 2024-06-11 ENCOUNTER — Encounter: Payer: Self-pay | Admitting: Cardiology

## 2024-06-11 VITALS — BP 132/60 | HR 72 | Ht 66.0 in | Wt 182.0 lb

## 2024-06-11 DIAGNOSIS — I471 Supraventricular tachycardia, unspecified: Secondary | ICD-10-CM

## 2024-06-11 DIAGNOSIS — M79605 Pain in left leg: Secondary | ICD-10-CM

## 2024-06-11 DIAGNOSIS — E785 Hyperlipidemia, unspecified: Secondary | ICD-10-CM

## 2024-06-11 DIAGNOSIS — M79604 Pain in right leg: Secondary | ICD-10-CM | POA: Diagnosis not present

## 2024-06-11 DIAGNOSIS — I2510 Atherosclerotic heart disease of native coronary artery without angina pectoris: Secondary | ICD-10-CM | POA: Diagnosis not present

## 2024-06-11 NOTE — Patient Instructions (Signed)
 Medication Instructions:  DISCONTINUED METOPROLOL  SUCCINATE  *If you need a refill on your cardiac medications before your next appointment, please call your pharmacy*  Lab Work: NO LABS If you have labs (blood work) drawn today and your tests are completely normal, you will receive your results only by: MyChart Message (if you have MyChart) OR A paper copy in the mail If you have any lab test that is abnormal or we need to change your treatment, we will call you to review the results.  Testing/Procedures: NO TESTING  Follow-Up: At [facility], you and your health needs are our priority.  As part of our continuing mission to provide you with exceptional heart care, our providers are all part of one team.  This team includes your primary Cardiologist (physician) and Advanced Practice Providers or APPs (Physician Assistants and Nurse Practitioners) who all work together to provide you with the care you need, when you need it.  Your next appointment:   6 month(s)  Provider:   Lonni LITTIE Nanas, MD

## 2024-07-21 ENCOUNTER — Other Ambulatory Visit: Payer: Self-pay | Admitting: Pharmacist

## 2024-07-21 DIAGNOSIS — E785 Hyperlipidemia, unspecified: Secondary | ICD-10-CM

## 2024-07-21 LAB — LIPID PANEL
Chol/HDL Ratio: 2.1 ratio (ref 0.0–4.4)
Cholesterol, Total: 155 mg/dL (ref 100–199)
HDL: 73 mg/dL
LDL Chol Calc (NIH): 69 mg/dL (ref 0–99)
Triglycerides: 63 mg/dL (ref 0–149)
VLDL Cholesterol Cal: 13 mg/dL (ref 5–40)

## 2024-07-22 ENCOUNTER — Ambulatory Visit (HOSPITAL_BASED_OUTPATIENT_CLINIC_OR_DEPARTMENT_OTHER): Payer: Self-pay | Admitting: Pharmacist Clinician (PhC)/ Clinical Pharmacy Specialist
